# Patient Record
Sex: Male | Born: 1935 | Race: White | Hispanic: No | Marital: Married | Smoking: Never smoker
Health system: Southern US, Community
[De-identification: ages and names within clinical notes are randomized; demographics above are authoritative.]

## PROBLEM LIST (undated history)

## (undated) DIAGNOSIS — R000 Tachycardia, unspecified: Secondary | ICD-10-CM

## (undated) DIAGNOSIS — K219 Gastro-esophageal reflux disease without esophagitis: Secondary | ICD-10-CM

## (undated) DIAGNOSIS — N189 Chronic kidney disease, unspecified: Secondary | ICD-10-CM

## (undated) DIAGNOSIS — R251 Tremor, unspecified: Secondary | ICD-10-CM

## (undated) DIAGNOSIS — J309 Allergic rhinitis, unspecified: Secondary | ICD-10-CM

## (undated) DIAGNOSIS — M1990 Unspecified osteoarthritis, unspecified site: Secondary | ICD-10-CM

## (undated) DIAGNOSIS — I10 Essential (primary) hypertension: Secondary | ICD-10-CM

## (undated) DIAGNOSIS — N400 Enlarged prostate without lower urinary tract symptoms: Secondary | ICD-10-CM

## (undated) HISTORY — DX: Essential (primary) hypertension: I10

## (undated) HISTORY — DX: Gastro-esophageal reflux disease without esophagitis: K21.9

## (undated) HISTORY — DX: Chronic kidney disease, unspecified: N18.9

## (undated) HISTORY — PX: CATARACT EXTRACTION: SUR2

## (undated) HISTORY — DX: Allergic rhinitis, unspecified: J30.9

## (undated) HISTORY — DX: Tremor, unspecified: R25.1

## (undated) HISTORY — PX: HERNIA REPAIR: SHX51

## (undated) HISTORY — PX: NOSE SURGERY: SHX723

## (undated) HISTORY — PX: CARDIAC CATHETERIZATION: SHX172

## (undated) HISTORY — PX: COLONOSCOPY: SHX174

## (undated) HISTORY — DX: Benign prostatic hyperplasia without lower urinary tract symptoms: N40.0

---

## 2001-11-27 ENCOUNTER — Ambulatory Visit (HOSPITAL_COMMUNITY): Admission: RE | Admit: 2001-11-27 | Discharge: 2001-11-27 | Payer: Self-pay | Admitting: *Deleted

## 2001-11-27 ENCOUNTER — Encounter: Payer: Self-pay | Admitting: *Deleted

## 2009-05-24 ENCOUNTER — Ambulatory Visit: Payer: Self-pay | Admitting: Cardiology

## 2011-12-09 DIAGNOSIS — M9981 Other biomechanical lesions of cervical region: Secondary | ICD-10-CM | POA: Diagnosis not present

## 2011-12-09 DIAGNOSIS — M531 Cervicobrachial syndrome: Secondary | ICD-10-CM | POA: Diagnosis not present

## 2011-12-09 DIAGNOSIS — M4712 Other spondylosis with myelopathy, cervical region: Secondary | ICD-10-CM | POA: Diagnosis not present

## 2011-12-12 DIAGNOSIS — M9981 Other biomechanical lesions of cervical region: Secondary | ICD-10-CM | POA: Diagnosis not present

## 2011-12-12 DIAGNOSIS — M531 Cervicobrachial syndrome: Secondary | ICD-10-CM | POA: Diagnosis not present

## 2011-12-12 DIAGNOSIS — M4712 Other spondylosis with myelopathy, cervical region: Secondary | ICD-10-CM | POA: Diagnosis not present

## 2011-12-16 DIAGNOSIS — M9981 Other biomechanical lesions of cervical region: Secondary | ICD-10-CM | POA: Diagnosis not present

## 2011-12-16 DIAGNOSIS — M4712 Other spondylosis with myelopathy, cervical region: Secondary | ICD-10-CM | POA: Diagnosis not present

## 2011-12-16 DIAGNOSIS — M531 Cervicobrachial syndrome: Secondary | ICD-10-CM | POA: Diagnosis not present

## 2011-12-30 DIAGNOSIS — M9981 Other biomechanical lesions of cervical region: Secondary | ICD-10-CM | POA: Diagnosis not present

## 2011-12-30 DIAGNOSIS — M531 Cervicobrachial syndrome: Secondary | ICD-10-CM | POA: Diagnosis not present

## 2011-12-30 DIAGNOSIS — M4712 Other spondylosis with myelopathy, cervical region: Secondary | ICD-10-CM | POA: Diagnosis not present

## 2012-04-09 DIAGNOSIS — N400 Enlarged prostate without lower urinary tract symptoms: Secondary | ICD-10-CM | POA: Diagnosis not present

## 2012-04-09 DIAGNOSIS — I10 Essential (primary) hypertension: Secondary | ICD-10-CM | POA: Diagnosis not present

## 2012-04-15 DIAGNOSIS — I10 Essential (primary) hypertension: Secondary | ICD-10-CM | POA: Diagnosis not present

## 2012-04-15 DIAGNOSIS — J309 Allergic rhinitis, unspecified: Secondary | ICD-10-CM | POA: Diagnosis not present

## 2012-04-15 DIAGNOSIS — Z23 Encounter for immunization: Secondary | ICD-10-CM | POA: Diagnosis not present

## 2012-09-09 DIAGNOSIS — R51 Headache: Secondary | ICD-10-CM | POA: Diagnosis not present

## 2012-09-09 DIAGNOSIS — I10 Essential (primary) hypertension: Secondary | ICD-10-CM | POA: Diagnosis not present

## 2012-10-05 DIAGNOSIS — R05 Cough: Secondary | ICD-10-CM | POA: Diagnosis not present

## 2012-10-05 DIAGNOSIS — I10 Essential (primary) hypertension: Secondary | ICD-10-CM | POA: Diagnosis not present

## 2012-10-15 DIAGNOSIS — H53029 Refractive amblyopia, unspecified eye: Secondary | ICD-10-CM | POA: Diagnosis not present

## 2012-10-15 DIAGNOSIS — H43319 Vitreous membranes and strands, unspecified eye: Secondary | ICD-10-CM | POA: Diagnosis not present

## 2013-01-08 DIAGNOSIS — L821 Other seborrheic keratosis: Secondary | ICD-10-CM | POA: Diagnosis not present

## 2013-01-08 DIAGNOSIS — I10 Essential (primary) hypertension: Secondary | ICD-10-CM | POA: Diagnosis not present

## 2013-01-08 DIAGNOSIS — R05 Cough: Secondary | ICD-10-CM | POA: Diagnosis not present

## 2013-01-08 DIAGNOSIS — L570 Actinic keratosis: Secondary | ICD-10-CM | POA: Diagnosis not present

## 2013-05-07 DIAGNOSIS — I10 Essential (primary) hypertension: Secondary | ICD-10-CM | POA: Diagnosis not present

## 2013-05-14 DIAGNOSIS — Z23 Encounter for immunization: Secondary | ICD-10-CM | POA: Diagnosis not present

## 2013-05-14 DIAGNOSIS — I10 Essential (primary) hypertension: Secondary | ICD-10-CM | POA: Diagnosis not present

## 2013-05-14 DIAGNOSIS — L821 Other seborrheic keratosis: Secondary | ICD-10-CM | POA: Diagnosis not present

## 2013-05-14 DIAGNOSIS — N189 Chronic kidney disease, unspecified: Secondary | ICD-10-CM | POA: Diagnosis not present

## 2013-05-14 DIAGNOSIS — R7309 Other abnormal glucose: Secondary | ICD-10-CM | POA: Diagnosis not present

## 2013-05-14 DIAGNOSIS — E786 Lipoprotein deficiency: Secondary | ICD-10-CM | POA: Diagnosis not present

## 2013-11-04 DIAGNOSIS — R7309 Other abnormal glucose: Secondary | ICD-10-CM | POA: Diagnosis not present

## 2013-11-04 DIAGNOSIS — I10 Essential (primary) hypertension: Secondary | ICD-10-CM | POA: Diagnosis not present

## 2013-11-11 DIAGNOSIS — R7309 Other abnormal glucose: Secondary | ICD-10-CM | POA: Diagnosis not present

## 2013-11-11 DIAGNOSIS — K2100 Gastro-esophageal reflux disease with esophagitis, without bleeding: Secondary | ICD-10-CM | POA: Diagnosis not present

## 2013-11-11 DIAGNOSIS — N400 Enlarged prostate without lower urinary tract symptoms: Secondary | ICD-10-CM | POA: Diagnosis not present

## 2013-11-11 DIAGNOSIS — E786 Lipoprotein deficiency: Secondary | ICD-10-CM | POA: Diagnosis not present

## 2013-11-11 DIAGNOSIS — I10 Essential (primary) hypertension: Secondary | ICD-10-CM | POA: Diagnosis not present

## 2013-11-11 DIAGNOSIS — N189 Chronic kidney disease, unspecified: Secondary | ICD-10-CM | POA: Diagnosis not present

## 2014-01-04 DIAGNOSIS — H26499 Other secondary cataract, unspecified eye: Secondary | ICD-10-CM | POA: Diagnosis not present

## 2014-01-15 DIAGNOSIS — IMO0002 Reserved for concepts with insufficient information to code with codable children: Secondary | ICD-10-CM | POA: Diagnosis not present

## 2014-01-15 DIAGNOSIS — S51809A Unspecified open wound of unspecified forearm, initial encounter: Secondary | ICD-10-CM | POA: Diagnosis not present

## 2014-01-17 DIAGNOSIS — M47817 Spondylosis without myelopathy or radiculopathy, lumbosacral region: Secondary | ICD-10-CM | POA: Diagnosis not present

## 2014-01-17 DIAGNOSIS — M999 Biomechanical lesion, unspecified: Secondary | ICD-10-CM | POA: Diagnosis not present

## 2014-01-19 DIAGNOSIS — M999 Biomechanical lesion, unspecified: Secondary | ICD-10-CM | POA: Diagnosis not present

## 2014-01-19 DIAGNOSIS — M47817 Spondylosis without myelopathy or radiculopathy, lumbosacral region: Secondary | ICD-10-CM | POA: Diagnosis not present

## 2014-01-20 DIAGNOSIS — M999 Biomechanical lesion, unspecified: Secondary | ICD-10-CM | POA: Diagnosis not present

## 2014-01-20 DIAGNOSIS — M47817 Spondylosis without myelopathy or radiculopathy, lumbosacral region: Secondary | ICD-10-CM | POA: Diagnosis not present

## 2014-05-11 DIAGNOSIS — N189 Chronic kidney disease, unspecified: Secondary | ICD-10-CM | POA: Diagnosis not present

## 2014-05-11 DIAGNOSIS — I10 Essential (primary) hypertension: Secondary | ICD-10-CM | POA: Diagnosis not present

## 2014-05-11 DIAGNOSIS — R739 Hyperglycemia, unspecified: Secondary | ICD-10-CM | POA: Diagnosis not present

## 2014-05-20 DIAGNOSIS — E786 Lipoprotein deficiency: Secondary | ICD-10-CM | POA: Diagnosis not present

## 2014-05-20 DIAGNOSIS — I10 Essential (primary) hypertension: Secondary | ICD-10-CM | POA: Diagnosis not present

## 2014-05-20 DIAGNOSIS — N400 Enlarged prostate without lower urinary tract symptoms: Secondary | ICD-10-CM | POA: Diagnosis not present

## 2014-05-20 DIAGNOSIS — K210 Gastro-esophageal reflux disease with esophagitis: Secondary | ICD-10-CM | POA: Diagnosis not present

## 2014-05-20 DIAGNOSIS — N183 Chronic kidney disease, stage 3 (moderate): Secondary | ICD-10-CM | POA: Diagnosis not present

## 2014-05-20 DIAGNOSIS — Z23 Encounter for immunization: Secondary | ICD-10-CM | POA: Diagnosis not present

## 2014-10-24 DIAGNOSIS — Z961 Presence of intraocular lens: Secondary | ICD-10-CM | POA: Diagnosis not present

## 2014-11-09 DIAGNOSIS — H26491 Other secondary cataract, right eye: Secondary | ICD-10-CM | POA: Diagnosis not present

## 2014-11-10 DIAGNOSIS — N183 Chronic kidney disease, stage 3 (moderate): Secondary | ICD-10-CM | POA: Diagnosis not present

## 2014-11-10 DIAGNOSIS — K210 Gastro-esophageal reflux disease with esophagitis: Secondary | ICD-10-CM | POA: Diagnosis not present

## 2014-11-10 DIAGNOSIS — I10 Essential (primary) hypertension: Secondary | ICD-10-CM | POA: Diagnosis not present

## 2014-11-10 DIAGNOSIS — R739 Hyperglycemia, unspecified: Secondary | ICD-10-CM | POA: Diagnosis not present

## 2014-11-16 DIAGNOSIS — K210 Gastro-esophageal reflux disease with esophagitis: Secondary | ICD-10-CM | POA: Diagnosis not present

## 2014-11-16 DIAGNOSIS — Z1389 Encounter for screening for other disorder: Secondary | ICD-10-CM | POA: Diagnosis not present

## 2014-11-16 DIAGNOSIS — D485 Neoplasm of uncertain behavior of skin: Secondary | ICD-10-CM | POA: Diagnosis not present

## 2014-11-16 DIAGNOSIS — I10 Essential (primary) hypertension: Secondary | ICD-10-CM | POA: Diagnosis not present

## 2014-11-16 DIAGNOSIS — J301 Allergic rhinitis due to pollen: Secondary | ICD-10-CM | POA: Diagnosis not present

## 2014-11-16 DIAGNOSIS — Z0001 Encounter for general adult medical examination with abnormal findings: Secondary | ICD-10-CM | POA: Diagnosis not present

## 2015-01-09 DIAGNOSIS — Z961 Presence of intraocular lens: Secondary | ICD-10-CM | POA: Diagnosis not present

## 2015-03-20 DIAGNOSIS — L570 Actinic keratosis: Secondary | ICD-10-CM | POA: Diagnosis not present

## 2015-03-20 DIAGNOSIS — L821 Other seborrheic keratosis: Secondary | ICD-10-CM | POA: Diagnosis not present

## 2015-03-20 DIAGNOSIS — L820 Inflamed seborrheic keratosis: Secondary | ICD-10-CM | POA: Diagnosis not present

## 2015-03-20 DIAGNOSIS — D0421 Carcinoma in situ of skin of right ear and external auricular canal: Secondary | ICD-10-CM | POA: Diagnosis not present

## 2015-03-20 DIAGNOSIS — D485 Neoplasm of uncertain behavior of skin: Secondary | ICD-10-CM | POA: Diagnosis not present

## 2015-04-06 DIAGNOSIS — C44222 Squamous cell carcinoma of skin of right ear and external auricular canal: Secondary | ICD-10-CM | POA: Diagnosis not present

## 2015-05-09 DIAGNOSIS — I10 Essential (primary) hypertension: Secondary | ICD-10-CM | POA: Diagnosis not present

## 2015-05-09 DIAGNOSIS — R739 Hyperglycemia, unspecified: Secondary | ICD-10-CM | POA: Diagnosis not present

## 2015-05-09 DIAGNOSIS — K210 Gastro-esophageal reflux disease with esophagitis: Secondary | ICD-10-CM | POA: Diagnosis not present

## 2015-05-09 DIAGNOSIS — N183 Chronic kidney disease, stage 3 (moderate): Secondary | ICD-10-CM | POA: Diagnosis not present

## 2015-05-16 DIAGNOSIS — N401 Enlarged prostate with lower urinary tract symptoms: Secondary | ICD-10-CM | POA: Diagnosis not present

## 2015-05-16 DIAGNOSIS — I10 Essential (primary) hypertension: Secondary | ICD-10-CM | POA: Diagnosis not present

## 2015-05-16 DIAGNOSIS — E786 Lipoprotein deficiency: Secondary | ICD-10-CM | POA: Diagnosis not present

## 2015-05-16 DIAGNOSIS — K210 Gastro-esophageal reflux disease with esophagitis: Secondary | ICD-10-CM | POA: Diagnosis not present

## 2015-05-16 DIAGNOSIS — N183 Chronic kidney disease, stage 3 (moderate): Secondary | ICD-10-CM | POA: Diagnosis not present

## 2015-05-16 DIAGNOSIS — Z23 Encounter for immunization: Secondary | ICD-10-CM | POA: Diagnosis not present

## 2015-06-06 DIAGNOSIS — D485 Neoplasm of uncertain behavior of skin: Secondary | ICD-10-CM | POA: Diagnosis not present

## 2015-06-06 DIAGNOSIS — D225 Melanocytic nevi of trunk: Secondary | ICD-10-CM | POA: Diagnosis not present

## 2015-06-06 DIAGNOSIS — L570 Actinic keratosis: Secondary | ICD-10-CM | POA: Diagnosis not present

## 2015-06-19 DIAGNOSIS — J209 Acute bronchitis, unspecified: Secondary | ICD-10-CM | POA: Diagnosis not present

## 2015-06-19 DIAGNOSIS — R05 Cough: Secondary | ICD-10-CM | POA: Diagnosis not present

## 2015-07-28 DIAGNOSIS — H8112 Benign paroxysmal vertigo, left ear: Secondary | ICD-10-CM | POA: Diagnosis not present

## 2015-07-28 DIAGNOSIS — N400 Enlarged prostate without lower urinary tract symptoms: Secondary | ICD-10-CM | POA: Diagnosis not present

## 2015-08-07 DIAGNOSIS — D485 Neoplasm of uncertain behavior of skin: Secondary | ICD-10-CM | POA: Diagnosis not present

## 2015-08-07 DIAGNOSIS — C44629 Squamous cell carcinoma of skin of left upper limb, including shoulder: Secondary | ICD-10-CM | POA: Diagnosis not present

## 2015-08-07 DIAGNOSIS — L570 Actinic keratosis: Secondary | ICD-10-CM | POA: Diagnosis not present

## 2015-08-07 DIAGNOSIS — L280 Lichen simplex chronicus: Secondary | ICD-10-CM | POA: Diagnosis not present

## 2015-08-07 DIAGNOSIS — D0462 Carcinoma in situ of skin of left upper limb, including shoulder: Secondary | ICD-10-CM | POA: Diagnosis not present

## 2015-08-17 DIAGNOSIS — C44629 Squamous cell carcinoma of skin of left upper limb, including shoulder: Secondary | ICD-10-CM | POA: Diagnosis not present

## 2015-10-31 DIAGNOSIS — K210 Gastro-esophageal reflux disease with esophagitis: Secondary | ICD-10-CM | POA: Diagnosis not present

## 2015-10-31 DIAGNOSIS — I10 Essential (primary) hypertension: Secondary | ICD-10-CM | POA: Diagnosis not present

## 2015-10-31 DIAGNOSIS — R739 Hyperglycemia, unspecified: Secondary | ICD-10-CM | POA: Diagnosis not present

## 2015-10-31 DIAGNOSIS — N183 Chronic kidney disease, stage 3 (moderate): Secondary | ICD-10-CM | POA: Diagnosis not present

## 2015-11-01 DIAGNOSIS — I10 Essential (primary) hypertension: Secondary | ICD-10-CM | POA: Diagnosis not present

## 2015-11-23 DIAGNOSIS — F5221 Male erectile disorder: Secondary | ICD-10-CM | POA: Diagnosis not present

## 2015-11-23 DIAGNOSIS — N183 Chronic kidney disease, stage 3 (moderate): Secondary | ICD-10-CM | POA: Diagnosis not present

## 2015-11-23 DIAGNOSIS — K210 Gastro-esophageal reflux disease with esophagitis: Secondary | ICD-10-CM | POA: Diagnosis not present

## 2015-11-23 DIAGNOSIS — Z0001 Encounter for general adult medical examination with abnormal findings: Secondary | ICD-10-CM | POA: Diagnosis not present

## 2015-11-23 DIAGNOSIS — I10 Essential (primary) hypertension: Secondary | ICD-10-CM | POA: Diagnosis not present

## 2015-11-23 DIAGNOSIS — N401 Enlarged prostate with lower urinary tract symptoms: Secondary | ICD-10-CM | POA: Diagnosis not present

## 2016-01-23 DIAGNOSIS — N400 Enlarged prostate without lower urinary tract symptoms: Secondary | ICD-10-CM | POA: Diagnosis not present

## 2016-02-05 DIAGNOSIS — L570 Actinic keratosis: Secondary | ICD-10-CM | POA: Diagnosis not present

## 2016-05-21 DIAGNOSIS — Z23 Encounter for immunization: Secondary | ICD-10-CM | POA: Diagnosis not present

## 2016-05-25 DIAGNOSIS — S68022A Partial traumatic metacarpophalangeal amputation of left thumb, initial encounter: Secondary | ICD-10-CM | POA: Diagnosis not present

## 2016-05-25 DIAGNOSIS — S61012A Laceration without foreign body of left thumb without damage to nail, initial encounter: Secondary | ICD-10-CM | POA: Diagnosis not present

## 2016-05-25 DIAGNOSIS — W312XXA Contact with powered woodworking and forming machines, initial encounter: Secondary | ICD-10-CM | POA: Diagnosis not present

## 2016-05-27 DIAGNOSIS — S61019D Laceration without foreign body of unspecified thumb without damage to nail, subsequent encounter: Secondary | ICD-10-CM | POA: Diagnosis not present

## 2016-05-27 DIAGNOSIS — Z4800 Encounter for change or removal of nonsurgical wound dressing: Secondary | ICD-10-CM | POA: Diagnosis not present

## 2016-05-27 DIAGNOSIS — S61012D Laceration without foreign body of left thumb without damage to nail, subsequent encounter: Secondary | ICD-10-CM | POA: Diagnosis not present

## 2016-05-29 DIAGNOSIS — S61012D Laceration without foreign body of left thumb without damage to nail, subsequent encounter: Secondary | ICD-10-CM | POA: Diagnosis not present

## 2016-05-30 DIAGNOSIS — M79645 Pain in left finger(s): Secondary | ICD-10-CM | POA: Diagnosis not present

## 2016-05-30 DIAGNOSIS — S61112A Laceration without foreign body of left thumb with damage to nail, initial encounter: Secondary | ICD-10-CM | POA: Diagnosis not present

## 2016-06-05 DIAGNOSIS — M79645 Pain in left finger(s): Secondary | ICD-10-CM | POA: Diagnosis not present

## 2016-06-05 DIAGNOSIS — S61112A Laceration without foreign body of left thumb with damage to nail, initial encounter: Secondary | ICD-10-CM | POA: Diagnosis not present

## 2016-06-11 DIAGNOSIS — S61112A Laceration without foreign body of left thumb with damage to nail, initial encounter: Secondary | ICD-10-CM | POA: Diagnosis not present

## 2016-07-02 DIAGNOSIS — S61112A Laceration without foreign body of left thumb with damage to nail, initial encounter: Secondary | ICD-10-CM | POA: Diagnosis not present

## 2016-08-07 DIAGNOSIS — L570 Actinic keratosis: Secondary | ICD-10-CM | POA: Diagnosis not present

## 2016-11-21 DIAGNOSIS — N183 Chronic kidney disease, stage 3 (moderate): Secondary | ICD-10-CM | POA: Diagnosis not present

## 2016-11-21 DIAGNOSIS — I10 Essential (primary) hypertension: Secondary | ICD-10-CM | POA: Diagnosis not present

## 2016-11-21 DIAGNOSIS — R739 Hyperglycemia, unspecified: Secondary | ICD-10-CM | POA: Diagnosis not present

## 2016-11-21 DIAGNOSIS — K210 Gastro-esophageal reflux disease with esophagitis: Secondary | ICD-10-CM | POA: Diagnosis not present

## 2016-11-25 DIAGNOSIS — N183 Chronic kidney disease, stage 3 (moderate): Secondary | ICD-10-CM | POA: Diagnosis not present

## 2016-11-25 DIAGNOSIS — M40295 Other kyphosis, thoracolumbar region: Secondary | ICD-10-CM | POA: Diagnosis not present

## 2016-11-25 DIAGNOSIS — Z23 Encounter for immunization: Secondary | ICD-10-CM | POA: Diagnosis not present

## 2016-11-25 DIAGNOSIS — I10 Essential (primary) hypertension: Secondary | ICD-10-CM | POA: Diagnosis not present

## 2016-11-25 DIAGNOSIS — F5221 Male erectile disorder: Secondary | ICD-10-CM | POA: Diagnosis not present

## 2016-11-25 DIAGNOSIS — Z1212 Encounter for screening for malignant neoplasm of rectum: Secondary | ICD-10-CM | POA: Diagnosis not present

## 2016-11-25 DIAGNOSIS — Z6828 Body mass index (BMI) 28.0-28.9, adult: Secondary | ICD-10-CM | POA: Diagnosis not present

## 2016-11-25 DIAGNOSIS — Z0001 Encounter for general adult medical examination with abnormal findings: Secondary | ICD-10-CM | POA: Diagnosis not present

## 2016-11-30 DIAGNOSIS — J301 Allergic rhinitis due to pollen: Secondary | ICD-10-CM | POA: Diagnosis not present

## 2016-11-30 DIAGNOSIS — Z6828 Body mass index (BMI) 28.0-28.9, adult: Secondary | ICD-10-CM | POA: Diagnosis not present

## 2016-11-30 DIAGNOSIS — R05 Cough: Secondary | ICD-10-CM | POA: Diagnosis not present

## 2017-01-13 DIAGNOSIS — H43393 Other vitreous opacities, bilateral: Secondary | ICD-10-CM | POA: Diagnosis not present

## 2017-02-10 DIAGNOSIS — L570 Actinic keratosis: Secondary | ICD-10-CM | POA: Diagnosis not present

## 2017-04-25 DIAGNOSIS — M545 Low back pain: Secondary | ICD-10-CM | POA: Diagnosis not present

## 2017-04-25 DIAGNOSIS — M4317 Spondylolisthesis, lumbosacral region: Secondary | ICD-10-CM | POA: Diagnosis not present

## 2017-04-25 DIAGNOSIS — M47816 Spondylosis without myelopathy or radiculopathy, lumbar region: Secondary | ICD-10-CM | POA: Diagnosis not present

## 2017-04-25 DIAGNOSIS — Z23 Encounter for immunization: Secondary | ICD-10-CM | POA: Diagnosis not present

## 2017-04-25 DIAGNOSIS — N401 Enlarged prostate with lower urinary tract symptoms: Secondary | ICD-10-CM | POA: Diagnosis not present

## 2017-04-25 DIAGNOSIS — Z6828 Body mass index (BMI) 28.0-28.9, adult: Secondary | ICD-10-CM | POA: Diagnosis not present

## 2017-08-13 DIAGNOSIS — L570 Actinic keratosis: Secondary | ICD-10-CM | POA: Diagnosis not present

## 2017-08-13 DIAGNOSIS — Z85828 Personal history of other malignant neoplasm of skin: Secondary | ICD-10-CM | POA: Diagnosis not present

## 2017-08-13 DIAGNOSIS — D485 Neoplasm of uncertain behavior of skin: Secondary | ICD-10-CM | POA: Diagnosis not present

## 2017-08-13 DIAGNOSIS — D235 Other benign neoplasm of skin of trunk: Secondary | ICD-10-CM | POA: Diagnosis not present

## 2017-09-16 DIAGNOSIS — M5416 Radiculopathy, lumbar region: Secondary | ICD-10-CM | POA: Diagnosis not present

## 2017-10-03 DIAGNOSIS — M5136 Other intervertebral disc degeneration, lumbar region: Secondary | ICD-10-CM | POA: Diagnosis not present

## 2017-10-03 DIAGNOSIS — M545 Low back pain: Secondary | ICD-10-CM | POA: Diagnosis not present

## 2017-10-03 DIAGNOSIS — M5137 Other intervertebral disc degeneration, lumbosacral region: Secondary | ICD-10-CM | POA: Diagnosis not present

## 2017-10-22 DIAGNOSIS — M5416 Radiculopathy, lumbar region: Secondary | ICD-10-CM | POA: Diagnosis not present

## 2017-10-22 DIAGNOSIS — M5136 Other intervertebral disc degeneration, lumbar region: Secondary | ICD-10-CM | POA: Diagnosis not present

## 2017-10-22 DIAGNOSIS — M4316 Spondylolisthesis, lumbar region: Secondary | ICD-10-CM | POA: Diagnosis not present

## 2017-12-04 DIAGNOSIS — M5136 Other intervertebral disc degeneration, lumbar region: Secondary | ICD-10-CM | POA: Diagnosis not present

## 2017-12-04 DIAGNOSIS — M5416 Radiculopathy, lumbar region: Secondary | ICD-10-CM | POA: Diagnosis not present

## 2017-12-24 DIAGNOSIS — M5136 Other intervertebral disc degeneration, lumbar region: Secondary | ICD-10-CM | POA: Diagnosis not present

## 2017-12-24 DIAGNOSIS — M5416 Radiculopathy, lumbar region: Secondary | ICD-10-CM | POA: Diagnosis not present

## 2018-01-28 DIAGNOSIS — H524 Presbyopia: Secondary | ICD-10-CM | POA: Diagnosis not present

## 2018-01-28 DIAGNOSIS — H5203 Hypermetropia, bilateral: Secondary | ICD-10-CM | POA: Diagnosis not present

## 2018-01-28 DIAGNOSIS — H53021 Refractive amblyopia, right eye: Secondary | ICD-10-CM | POA: Diagnosis not present

## 2018-01-28 DIAGNOSIS — H43813 Vitreous degeneration, bilateral: Secondary | ICD-10-CM | POA: Diagnosis not present

## 2018-01-28 DIAGNOSIS — H52223 Regular astigmatism, bilateral: Secondary | ICD-10-CM | POA: Diagnosis not present

## 2018-01-28 DIAGNOSIS — Z961 Presence of intraocular lens: Secondary | ICD-10-CM | POA: Diagnosis not present

## 2018-02-05 DIAGNOSIS — E786 Lipoprotein deficiency: Secondary | ICD-10-CM | POA: Diagnosis not present

## 2018-02-05 DIAGNOSIS — I10 Essential (primary) hypertension: Secondary | ICD-10-CM | POA: Diagnosis not present

## 2018-02-05 DIAGNOSIS — N183 Chronic kidney disease, stage 3 (moderate): Secondary | ICD-10-CM | POA: Diagnosis not present

## 2018-02-05 DIAGNOSIS — R739 Hyperglycemia, unspecified: Secondary | ICD-10-CM | POA: Diagnosis not present

## 2018-02-05 DIAGNOSIS — K210 Gastro-esophageal reflux disease with esophagitis: Secondary | ICD-10-CM | POA: Diagnosis not present

## 2018-02-05 DIAGNOSIS — F5221 Male erectile disorder: Secondary | ICD-10-CM | POA: Diagnosis not present

## 2018-02-09 DIAGNOSIS — Z6827 Body mass index (BMI) 27.0-27.9, adult: Secondary | ICD-10-CM | POA: Diagnosis not present

## 2018-02-09 DIAGNOSIS — F5221 Male erectile disorder: Secondary | ICD-10-CM | POA: Diagnosis not present

## 2018-02-09 DIAGNOSIS — Z1212 Encounter for screening for malignant neoplasm of rectum: Secondary | ICD-10-CM | POA: Diagnosis not present

## 2018-02-09 DIAGNOSIS — K219 Gastro-esophageal reflux disease without esophagitis: Secondary | ICD-10-CM | POA: Diagnosis not present

## 2018-02-09 DIAGNOSIS — M40295 Other kyphosis, thoracolumbar region: Secondary | ICD-10-CM | POA: Diagnosis not present

## 2018-02-09 DIAGNOSIS — Z0001 Encounter for general adult medical examination with abnormal findings: Secondary | ICD-10-CM | POA: Diagnosis not present

## 2018-02-09 DIAGNOSIS — R634 Abnormal weight loss: Secondary | ICD-10-CM | POA: Diagnosis not present

## 2018-02-09 DIAGNOSIS — N183 Chronic kidney disease, stage 3 (moderate): Secondary | ICD-10-CM | POA: Diagnosis not present

## 2018-02-09 DIAGNOSIS — I10 Essential (primary) hypertension: Secondary | ICD-10-CM | POA: Diagnosis not present

## 2018-02-09 DIAGNOSIS — N401 Enlarged prostate with lower urinary tract symptoms: Secondary | ICD-10-CM | POA: Diagnosis not present

## 2018-02-10 DIAGNOSIS — L570 Actinic keratosis: Secondary | ICD-10-CM | POA: Diagnosis not present

## 2018-02-25 DIAGNOSIS — M5136 Other intervertebral disc degeneration, lumbar region: Secondary | ICD-10-CM | POA: Diagnosis not present

## 2018-02-25 DIAGNOSIS — M4316 Spondylolisthesis, lumbar region: Secondary | ICD-10-CM | POA: Diagnosis not present

## 2018-02-25 DIAGNOSIS — M5416 Radiculopathy, lumbar region: Secondary | ICD-10-CM | POA: Diagnosis not present

## 2018-02-28 DIAGNOSIS — M5416 Radiculopathy, lumbar region: Secondary | ICD-10-CM | POA: Diagnosis not present

## 2018-03-05 DIAGNOSIS — M5416 Radiculopathy, lumbar region: Secondary | ICD-10-CM | POA: Diagnosis not present

## 2018-03-05 DIAGNOSIS — M4316 Spondylolisthesis, lumbar region: Secondary | ICD-10-CM | POA: Diagnosis not present

## 2018-03-05 DIAGNOSIS — M48061 Spinal stenosis, lumbar region without neurogenic claudication: Secondary | ICD-10-CM | POA: Diagnosis not present

## 2018-03-05 DIAGNOSIS — M5136 Other intervertebral disc degeneration, lumbar region: Secondary | ICD-10-CM | POA: Diagnosis not present

## 2018-04-01 DIAGNOSIS — M5136 Other intervertebral disc degeneration, lumbar region: Secondary | ICD-10-CM | POA: Diagnosis not present

## 2018-04-01 DIAGNOSIS — M48061 Spinal stenosis, lumbar region without neurogenic claudication: Secondary | ICD-10-CM | POA: Diagnosis not present

## 2018-05-14 DIAGNOSIS — Z23 Encounter for immunization: Secondary | ICD-10-CM | POA: Diagnosis not present

## 2018-06-16 DIAGNOSIS — M4316 Spondylolisthesis, lumbar region: Secondary | ICD-10-CM | POA: Diagnosis not present

## 2018-06-16 DIAGNOSIS — M5416 Radiculopathy, lumbar region: Secondary | ICD-10-CM | POA: Diagnosis not present

## 2018-06-27 DIAGNOSIS — J329 Chronic sinusitis, unspecified: Secondary | ICD-10-CM | POA: Diagnosis not present

## 2018-06-27 DIAGNOSIS — H109 Unspecified conjunctivitis: Secondary | ICD-10-CM | POA: Diagnosis not present

## 2018-06-27 DIAGNOSIS — Z6827 Body mass index (BMI) 27.0-27.9, adult: Secondary | ICD-10-CM | POA: Diagnosis not present

## 2018-07-07 DIAGNOSIS — M5136 Other intervertebral disc degeneration, lumbar region: Secondary | ICD-10-CM | POA: Diagnosis not present

## 2018-07-07 DIAGNOSIS — M5416 Radiculopathy, lumbar region: Secondary | ICD-10-CM | POA: Diagnosis not present

## 2018-08-03 DIAGNOSIS — D0422 Carcinoma in situ of skin of left ear and external auricular canal: Secondary | ICD-10-CM | POA: Diagnosis not present

## 2018-08-03 DIAGNOSIS — L570 Actinic keratosis: Secondary | ICD-10-CM | POA: Diagnosis not present

## 2018-08-03 DIAGNOSIS — D485 Neoplasm of uncertain behavior of skin: Secondary | ICD-10-CM | POA: Diagnosis not present

## 2018-08-06 DIAGNOSIS — N183 Chronic kidney disease, stage 3 (moderate): Secondary | ICD-10-CM | POA: Diagnosis not present

## 2018-08-06 DIAGNOSIS — I10 Essential (primary) hypertension: Secondary | ICD-10-CM | POA: Diagnosis not present

## 2018-08-06 DIAGNOSIS — R634 Abnormal weight loss: Secondary | ICD-10-CM | POA: Diagnosis not present

## 2018-08-06 DIAGNOSIS — R739 Hyperglycemia, unspecified: Secondary | ICD-10-CM | POA: Diagnosis not present

## 2018-08-06 DIAGNOSIS — K210 Gastro-esophageal reflux disease with esophagitis: Secondary | ICD-10-CM | POA: Diagnosis not present

## 2018-08-06 DIAGNOSIS — E786 Lipoprotein deficiency: Secondary | ICD-10-CM | POA: Diagnosis not present

## 2018-08-11 DIAGNOSIS — M40295 Other kyphosis, thoracolumbar region: Secondary | ICD-10-CM | POA: Diagnosis not present

## 2018-08-11 DIAGNOSIS — N183 Chronic kidney disease, stage 3 (moderate): Secondary | ICD-10-CM | POA: Diagnosis not present

## 2018-08-11 DIAGNOSIS — I10 Essential (primary) hypertension: Secondary | ICD-10-CM | POA: Diagnosis not present

## 2018-08-11 DIAGNOSIS — F5221 Male erectile disorder: Secondary | ICD-10-CM | POA: Diagnosis not present

## 2018-08-11 DIAGNOSIS — R634 Abnormal weight loss: Secondary | ICD-10-CM | POA: Diagnosis not present

## 2018-08-11 DIAGNOSIS — K219 Gastro-esophageal reflux disease without esophagitis: Secondary | ICD-10-CM | POA: Diagnosis not present

## 2018-08-11 DIAGNOSIS — N401 Enlarged prostate with lower urinary tract symptoms: Secondary | ICD-10-CM | POA: Diagnosis not present

## 2018-08-11 DIAGNOSIS — Z6827 Body mass index (BMI) 27.0-27.9, adult: Secondary | ICD-10-CM | POA: Diagnosis not present

## 2018-08-13 DIAGNOSIS — C44329 Squamous cell carcinoma of skin of other parts of face: Secondary | ICD-10-CM | POA: Diagnosis not present

## 2018-09-01 DIAGNOSIS — M1711 Unilateral primary osteoarthritis, right knee: Secondary | ICD-10-CM | POA: Diagnosis not present

## 2018-09-01 DIAGNOSIS — M25561 Pain in right knee: Secondary | ICD-10-CM | POA: Diagnosis not present

## 2018-09-04 ENCOUNTER — Other Ambulatory Visit: Payer: Self-pay | Admitting: Specialist

## 2018-09-04 DIAGNOSIS — M5416 Radiculopathy, lumbar region: Secondary | ICD-10-CM

## 2018-09-07 ENCOUNTER — Telehealth: Payer: Self-pay | Admitting: Nurse Practitioner

## 2018-09-07 NOTE — Telephone Encounter (Signed)
Phone call to patient to verify medication list and allergies for myelogram procedure. Spoke with wife, pt instructed to hold Tramadol for 48hrs prior to myelogram appointment time. Pt's wife verbalized understanding.

## 2018-09-14 ENCOUNTER — Ambulatory Visit
Admission: RE | Admit: 2018-09-14 | Discharge: 2018-09-14 | Disposition: A | Discharge status: Home / Self Care | Payer: Self-pay | Source: Ambulatory Visit | Attending: Specialist | Admitting: Specialist

## 2018-09-14 ENCOUNTER — Other Ambulatory Visit: Payer: Self-pay | Admitting: Specialist

## 2018-09-14 DIAGNOSIS — M5416 Radiculopathy, lumbar region: Secondary | ICD-10-CM

## 2018-09-15 ENCOUNTER — Ambulatory Visit
Admission: RE | Admit: 2018-09-15 | Discharge: 2018-09-15 | Disposition: A | Discharge status: Home / Self Care | Payer: Self-pay | Source: Ambulatory Visit | Attending: Specialist | Admitting: Specialist

## 2018-09-15 ENCOUNTER — Ambulatory Visit
Admission: RE | Admit: 2018-09-15 | Discharge: 2018-09-15 | Disposition: A | Discharge status: Home / Self Care | Payer: Medicare Other | Source: Ambulatory Visit | Attending: Specialist | Admitting: Specialist

## 2018-09-15 DIAGNOSIS — M48061 Spinal stenosis, lumbar region without neurogenic claudication: Secondary | ICD-10-CM | POA: Diagnosis not present

## 2018-09-15 DIAGNOSIS — M5416 Radiculopathy, lumbar region: Secondary | ICD-10-CM

## 2018-09-15 MED ORDER — DIAZEPAM 5 MG PO TABS
5.0000 mg | ORAL_TABLET | Freq: Once | ORAL | Status: AC
  Administered 2018-09-15: 5 mg via ORAL

## 2018-09-15 MED ORDER — IOPAMIDOL (ISOVUE-M 200) INJECTION 41%
15.0000 mL | Freq: Once | INTRAMUSCULAR | Status: AC
  Administered 2018-09-15: 15 mL via INTRATHECAL

## 2018-09-15 NOTE — Discharge Instructions (Signed)

## 2018-09-15 NOTE — Progress Notes (Signed)
Patient states he has been off Tramadol for at least the past two days.  Gypsy Lore, RN

## 2018-09-23 DIAGNOSIS — M5416 Radiculopathy, lumbar region: Secondary | ICD-10-CM | POA: Diagnosis not present

## 2018-09-23 DIAGNOSIS — M519 Unspecified thoracic, thoracolumbar and lumbosacral intervertebral disc disorder: Secondary | ICD-10-CM | POA: Diagnosis not present

## 2018-09-23 DIAGNOSIS — M5136 Other intervertebral disc degeneration, lumbar region: Secondary | ICD-10-CM | POA: Diagnosis not present

## 2018-09-23 DIAGNOSIS — M48061 Spinal stenosis, lumbar region without neurogenic claudication: Secondary | ICD-10-CM | POA: Diagnosis not present

## 2018-09-23 DIAGNOSIS — M25812 Other specified joint disorders, left shoulder: Secondary | ICD-10-CM | POA: Diagnosis not present

## 2018-09-23 DIAGNOSIS — M25512 Pain in left shoulder: Secondary | ICD-10-CM | POA: Diagnosis not present

## 2018-10-06 DIAGNOSIS — M545 Low back pain: Secondary | ICD-10-CM | POA: Diagnosis not present

## 2018-12-23 DIAGNOSIS — M1711 Unilateral primary osteoarthritis, right knee: Secondary | ICD-10-CM | POA: Diagnosis not present

## 2018-12-30 DIAGNOSIS — M1711 Unilateral primary osteoarthritis, right knee: Secondary | ICD-10-CM | POA: Diagnosis not present

## 2019-01-06 DIAGNOSIS — M1711 Unilateral primary osteoarthritis, right knee: Secondary | ICD-10-CM | POA: Diagnosis not present

## 2019-02-02 DIAGNOSIS — M25512 Pain in left shoulder: Secondary | ICD-10-CM | POA: Diagnosis not present

## 2019-02-02 DIAGNOSIS — M6281 Muscle weakness (generalized): Secondary | ICD-10-CM | POA: Diagnosis not present

## 2019-02-04 DIAGNOSIS — M25512 Pain in left shoulder: Secondary | ICD-10-CM | POA: Diagnosis not present

## 2019-02-04 DIAGNOSIS — M6281 Muscle weakness (generalized): Secondary | ICD-10-CM | POA: Diagnosis not present

## 2019-02-08 DIAGNOSIS — L570 Actinic keratosis: Secondary | ICD-10-CM | POA: Diagnosis not present

## 2019-02-09 DIAGNOSIS — M25512 Pain in left shoulder: Secondary | ICD-10-CM | POA: Diagnosis not present

## 2019-02-09 DIAGNOSIS — M6281 Muscle weakness (generalized): Secondary | ICD-10-CM | POA: Diagnosis not present

## 2019-02-11 DIAGNOSIS — M25512 Pain in left shoulder: Secondary | ICD-10-CM | POA: Diagnosis not present

## 2019-02-11 DIAGNOSIS — M6281 Muscle weakness (generalized): Secondary | ICD-10-CM | POA: Diagnosis not present

## 2019-02-16 DIAGNOSIS — M6281 Muscle weakness (generalized): Secondary | ICD-10-CM | POA: Diagnosis not present

## 2019-02-16 DIAGNOSIS — M25512 Pain in left shoulder: Secondary | ICD-10-CM | POA: Diagnosis not present

## 2019-02-18 DIAGNOSIS — M6281 Muscle weakness (generalized): Secondary | ICD-10-CM | POA: Diagnosis not present

## 2019-02-18 DIAGNOSIS — M25512 Pain in left shoulder: Secondary | ICD-10-CM | POA: Diagnosis not present

## 2019-02-23 DIAGNOSIS — M6281 Muscle weakness (generalized): Secondary | ICD-10-CM | POA: Diagnosis not present

## 2019-02-23 DIAGNOSIS — M25512 Pain in left shoulder: Secondary | ICD-10-CM | POA: Diagnosis not present

## 2019-02-25 DIAGNOSIS — M6281 Muscle weakness (generalized): Secondary | ICD-10-CM | POA: Diagnosis not present

## 2019-02-25 DIAGNOSIS — M25512 Pain in left shoulder: Secondary | ICD-10-CM | POA: Diagnosis not present

## 2019-03-02 DIAGNOSIS — M25512 Pain in left shoulder: Secondary | ICD-10-CM | POA: Diagnosis not present

## 2019-03-02 DIAGNOSIS — M6281 Muscle weakness (generalized): Secondary | ICD-10-CM | POA: Diagnosis not present

## 2019-03-04 DIAGNOSIS — M25512 Pain in left shoulder: Secondary | ICD-10-CM | POA: Diagnosis not present

## 2019-03-04 DIAGNOSIS — M6281 Muscle weakness (generalized): Secondary | ICD-10-CM | POA: Diagnosis not present

## 2019-03-09 DIAGNOSIS — M6281 Muscle weakness (generalized): Secondary | ICD-10-CM | POA: Diagnosis not present

## 2019-03-09 DIAGNOSIS — M25512 Pain in left shoulder: Secondary | ICD-10-CM | POA: Diagnosis not present

## 2019-03-11 DIAGNOSIS — M6281 Muscle weakness (generalized): Secondary | ICD-10-CM | POA: Diagnosis not present

## 2019-03-11 DIAGNOSIS — M25512 Pain in left shoulder: Secondary | ICD-10-CM | POA: Diagnosis not present

## 2019-04-27 DIAGNOSIS — N183 Chronic kidney disease, stage 3 (moderate): Secondary | ICD-10-CM | POA: Diagnosis not present

## 2019-04-27 DIAGNOSIS — I10 Essential (primary) hypertension: Secondary | ICD-10-CM | POA: Diagnosis not present

## 2019-04-27 DIAGNOSIS — R739 Hyperglycemia, unspecified: Secondary | ICD-10-CM | POA: Diagnosis not present

## 2019-04-27 DIAGNOSIS — R634 Abnormal weight loss: Secondary | ICD-10-CM | POA: Diagnosis not present

## 2019-04-27 DIAGNOSIS — K210 Gastro-esophageal reflux disease with esophagitis: Secondary | ICD-10-CM | POA: Diagnosis not present

## 2019-04-28 DIAGNOSIS — F5221 Male erectile disorder: Secondary | ICD-10-CM | POA: Diagnosis not present

## 2019-04-28 DIAGNOSIS — M4800 Spinal stenosis, site unspecified: Secondary | ICD-10-CM | POA: Diagnosis not present

## 2019-04-28 DIAGNOSIS — N401 Enlarged prostate with lower urinary tract symptoms: Secondary | ICD-10-CM | POA: Diagnosis not present

## 2019-04-28 DIAGNOSIS — N183 Chronic kidney disease, stage 3 (moderate): Secondary | ICD-10-CM | POA: Diagnosis not present

## 2019-04-28 DIAGNOSIS — Z6827 Body mass index (BMI) 27.0-27.9, adult: Secondary | ICD-10-CM | POA: Diagnosis not present

## 2019-04-28 DIAGNOSIS — Z23 Encounter for immunization: Secondary | ICD-10-CM | POA: Diagnosis not present

## 2019-04-28 DIAGNOSIS — I10 Essential (primary) hypertension: Secondary | ICD-10-CM | POA: Diagnosis not present

## 2019-04-28 DIAGNOSIS — M40295 Other kyphosis, thoracolumbar region: Secondary | ICD-10-CM | POA: Diagnosis not present

## 2019-05-04 DIAGNOSIS — H02831 Dermatochalasis of right upper eyelid: Secondary | ICD-10-CM | POA: Diagnosis not present

## 2019-05-04 DIAGNOSIS — H02834 Dermatochalasis of left upper eyelid: Secondary | ICD-10-CM | POA: Diagnosis not present

## 2019-05-04 DIAGNOSIS — H04123 Dry eye syndrome of bilateral lacrimal glands: Secondary | ICD-10-CM | POA: Diagnosis not present

## 2019-05-04 DIAGNOSIS — Z961 Presence of intraocular lens: Secondary | ICD-10-CM | POA: Diagnosis not present

## 2019-05-17 DIAGNOSIS — M48062 Spinal stenosis, lumbar region with neurogenic claudication: Secondary | ICD-10-CM | POA: Diagnosis not present

## 2019-05-17 DIAGNOSIS — M5136 Other intervertebral disc degeneration, lumbar region: Secondary | ICD-10-CM | POA: Diagnosis not present

## 2019-05-17 DIAGNOSIS — M4316 Spondylolisthesis, lumbar region: Secondary | ICD-10-CM | POA: Diagnosis not present

## 2019-05-17 DIAGNOSIS — M5416 Radiculopathy, lumbar region: Secondary | ICD-10-CM | POA: Diagnosis not present

## 2019-05-17 DIAGNOSIS — M519 Unspecified thoracic, thoracolumbar and lumbosacral intervertebral disc disorder: Secondary | ICD-10-CM | POA: Diagnosis not present

## 2019-05-17 DIAGNOSIS — M545 Low back pain: Secondary | ICD-10-CM | POA: Diagnosis not present

## 2019-05-28 DIAGNOSIS — I10 Essential (primary) hypertension: Secondary | ICD-10-CM | POA: Diagnosis not present

## 2019-05-28 DIAGNOSIS — K219 Gastro-esophageal reflux disease without esophagitis: Secondary | ICD-10-CM | POA: Diagnosis not present

## 2019-06-15 DIAGNOSIS — H02831 Dermatochalasis of right upper eyelid: Secondary | ICD-10-CM | POA: Diagnosis not present

## 2019-06-15 DIAGNOSIS — Z961 Presence of intraocular lens: Secondary | ICD-10-CM | POA: Diagnosis not present

## 2019-06-15 DIAGNOSIS — H02834 Dermatochalasis of left upper eyelid: Secondary | ICD-10-CM | POA: Diagnosis not present

## 2019-06-15 DIAGNOSIS — H04123 Dry eye syndrome of bilateral lacrimal glands: Secondary | ICD-10-CM | POA: Diagnosis not present

## 2019-06-28 DIAGNOSIS — I10 Essential (primary) hypertension: Secondary | ICD-10-CM | POA: Diagnosis not present

## 2019-06-28 DIAGNOSIS — K219 Gastro-esophageal reflux disease without esophagitis: Secondary | ICD-10-CM | POA: Diagnosis not present

## 2019-08-11 DIAGNOSIS — N183 Chronic kidney disease, stage 3 unspecified: Secondary | ICD-10-CM | POA: Diagnosis not present

## 2019-08-11 DIAGNOSIS — Z6827 Body mass index (BMI) 27.0-27.9, adult: Secondary | ICD-10-CM | POA: Diagnosis not present

## 2019-08-11 DIAGNOSIS — I10 Essential (primary) hypertension: Secondary | ICD-10-CM | POA: Diagnosis not present

## 2019-08-11 DIAGNOSIS — R251 Tremor, unspecified: Secondary | ICD-10-CM | POA: Diagnosis not present

## 2019-08-11 DIAGNOSIS — N401 Enlarged prostate with lower urinary tract symptoms: Secondary | ICD-10-CM | POA: Diagnosis not present

## 2019-08-19 ENCOUNTER — Ambulatory Visit (INDEPENDENT_AMBULATORY_CARE_PROVIDER_SITE_OTHER): Payer: Medicare Other | Admitting: Neurology

## 2019-08-19 ENCOUNTER — Other Ambulatory Visit: Payer: Self-pay

## 2019-08-19 ENCOUNTER — Encounter: Payer: Self-pay | Admitting: Neurology

## 2019-08-19 VITALS — BP 150/75 | HR 60 | Temp 97.1°F | Ht 69.0 in | Wt 191.3 lb

## 2019-08-19 DIAGNOSIS — G20 Parkinson's disease: Secondary | ICD-10-CM | POA: Diagnosis not present

## 2019-08-19 DIAGNOSIS — G20C Parkinsonism, unspecified: Secondary | ICD-10-CM

## 2019-08-19 MED ORDER — CARBIDOPA-LEVODOPA 25-100 MG PO TABS: ORAL_TABLET | ORAL | 3 refills | Status: DC

## 2019-08-19 NOTE — Progress Notes (Signed)
Subjective:    Patient ID: Gerald Knapp is a 86 y.o. male.  HPI     Star Age, MD, PhD [facility] [address], [address] [place], [place] [address]  Dear Dr. Quintin Alto,   I saw your patient, Gerald Knapp, upon your kind request in my neurologic clinic today for initial consultation of his tremor.  The patient is accompanied by his wife today.  As you know, Mr. [name] is an 86 year old right-handed gentleman with an underlying medical history of reflux disease, lumbar spinal stenosis with chronic low back pain, allergic rhinitis, and BPH, who reports a recent onset right more than left hand tremor, started in or around November 2020.  He reports it is primarily on the right side and happens primarily when he is sitting or when walking.  It does not seem to as noticeable when he holds something.  He does have some fine motor dyscontrol.  He has noticed difficulty swallowing at times, feels like he chokes on his own saliva even at times.  The choking-like sensation happens with liquids and solids.  He sleeps fairly well, he sleeps in a lift chair for the past year because of his low back pain.  He has seen Dr. Tonita Cong for this, he has also had steroid injections under Dr. Nelva Bush, about 3 or 4 altogether with limited success.  He is still active on his farm, they raise cows.  His daughter and son-in-law help on the farm, they stay on the farm.  He has no family history of tremor or Parkinson's disease.  Upon further asking, his wife has noticed change in his walking, he tends to shuffle at times and she has also noticed a more stooped posture.  He walks with a walking stick when he is outside on the farm.  He still uses his tractor and feeds the cattle.  Thankfully, he has not fallen.   His Past Medical History Is Significant For: Past Medical History:  Diagnosis Date  . Allergic rhinitis   . Benign prostate hyperplasia   . Chronic kidney disease   . GERD  (gastroesophageal reflux disease)   . Hypertension   . Tremor     His Past Surgical History Is Significant For: Past Surgical History:  Procedure Laterality Date  . CARDIAC CATHETERIZATION    . CATARACT EXTRACTION    . COLONOSCOPY    . HERNIA REPAIR    . NOSE SURGERY      His Family History Is Significant For: Family History  Problem Relation Age of Onset  . Arthritis Mother   . Hypertension Mother   . Cancer Father   . Diabetes Father   . Arthritis Father     His Social History Is Significant For: Social History   Socioeconomic History  . Marital status: Married    Spouse name: Lilly   . Number of children: Not on file  . Years of education: Not on file  . Highest education level: Not on file  Occupational History  . Not on file  Tobacco Use  . Smoking status: Never Smoker  . Smokeless tobacco: Never Used  Substance and Sexual Activity  . Alcohol use: Yes    Comment: Occastional Drink   . Drug use: Not on file  . Sexual activity: Not on file  Other Topics Concern  . Not on file  Social History Narrative  . Not on file   Social Determinants of Health   Financial Resource Strain:   .  Difficulty of Paying Living Expenses: Not on file  Food Insecurity:   . Worried About Charity fundraiser in the Last Year: Not on file  . Ran Out of Food in the Last Year: Not on file  Transportation Needs:   . Lack of Transportation (Medical): Not on file  . Lack of Transportation (Non-Medical): Not on file  Physical Activity:   . Days of Exercise per Week: Not on file  . Minutes of Exercise per Session: Not on file  Stress:   . Feeling of Stress : Not on file  Social Connections:   . Frequency of Communication with Friends and Family: Not on file  . Frequency of Social Gatherings with Friends and Family: Not on file  . Attends Religious Services: Not on file  . Active Member of Clubs or Organizations: Not on file  . Attends Archivist Meetings: Not on file   . Marital Status: Not on file    His Allergies Are:  No Known Allergies:   His Current Medications Are:  Outpatient Encounter Medications as of 08/19/2019  Medication Sig  . aspirin 81 MG chewable tablet Chew 81 mg by mouth daily.  . fluticasone (VERAMYST) 27.5 MCG/SPRAY nasal spray Place 2 sprays into the nose daily.  Marland Kitchen L-Lysine 500 MG CAPS Take by mouth.  . loratadine (CLARITIN) 10 MG tablet Take 10 mg by mouth daily.  . Probiotic Product (PROBIOTIC-10 PO) Take by mouth.  . terazosin (HYTRIN) 5 MG capsule Take 5 mg by mouth at bedtime.   No facility-administered encounter medications on file as of 08/19/2019.  :   Review of Systems:  Out of a complete 14 point review of systems, all are reviewed and negative with the exception of these symptoms as listed below: Review of Systems  Neurological:       Here for evaluation on tremor in bilateral hands. Right is worse than the left hand.     Objective:  Neurological Exam  Physical Exam Physical Examination:   Vitals:   08/19/19 0939  BP: (!) 150/75  Pulse: 60  Temp: (!) 97.1 F (36.2 C)    General Examination: The patient is a very pleasant 86 y.o. male in no acute distress. He appears well-developed and well-nourished and well groomed.   HEENT: Normocephalic, atraumatic, pupils are equal, round and reactive to light, Extraocular tracking is mildly impaired, he wears corrective eyeglasses, he is hard of hearing, no hearing aids.  Face is symmetric, mild facial masking noted, mild nuchal rigidity noted, no lip, neck or jaw tremor, airway examination reveals mild mouth dryness, no obvious dysarthria, perhaps mild hypophonia, no voice tremor, tongue protrudes centrally in palate elevates symmetrically. There are no carotid bruits on auscultation.   Chest: Clear to auscultation without wheezing, rhonchi or crackles noted.  Heart: S1+S2+0, regular and normal without murmurs, rubs or gallops noted.   Abdomen: Soft, non-tender  and non-distended with normal bowel sounds appreciated on auscultation.  Extremities: There is 1+ to 2+ pitting edema in the distal lower extremities bilaterally.   Skin: Warm and dry without trophic changes noted.  Musculoskeletal: exam reveals Arthritic changes in both hands, increase in lumbar kyphosis when he stands.  Neurologically:  Mental status: The patient is awake, alert and oriented in all 4 spheres. His immediate and remote memory, attention, language skills and fund of knowledge are appropriate. There is no evidence of aphasia, agnosia, apraxia or anomia. Speech is clear with normal prosody and enunciation. Thought process is linear.  Mood is normal and affect is normal.  Cranial nerves II - XII are as described above under HEENT exam. In addition: shoulder shrug is normal with equal shoulder height noted. Motor exam: Normal bulk, strength for age noted. There is no drift, or rebound. Romberg is Not tested for safety concerns.   On 08/19/2019: On Archimedes spiral drawing, he has mild insecurity with both hands but no obvious trembling noted, handwriting is legible, not particularly tremulous with the right hand but small. He has an intermittent resting tremor in the right upper extremity only.  The tremor is mild, at times moderate, especially with standing and walking.  He has a slight postural tremor in both upper extremities, no obvious action tremor, no intention tremor. Tone is increased on the right upper extremity with mild cogwheeling noted, slight increase in tone in the Left upper extremity without cogwheeling.  Overall mild bradykinesia noted.  Fine motor skills are mildly impaired to moderately impaired in the right upper and right lower extremities, minimal to mildly impaired on the left, he stands without difficulty but pushes himself up, posture is significantly stooped with increase in lumbar kyphosis noted.  He reports no significant back pain at this time.  He walks with  decreased stride length and decreased pace, impaired balance, decreased arm swing on the right.  He has some difficulty turning, did not bring his walking stick.   Assessment and Plan:   Assessment and Plan:  In summary, Sloane Pettaway is a very pleasant 86 y.o.-year old male with an underlying medical history of reflux disease, lumbar spinal stenosis with chronic low back pain, allergic rhinitis, and BPH, who Presents for evaluation of his tremor disorder.  He has on examination evidence of right-sided parkinsonism including a resting tremor but also mild fine motor dyscontrol and slowness and increase in tone.  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  I had a long discussion with the patient and his wife regarding his symptoms, his presentation and possible symptomatic treatment options.  He is encouraged to start a trial of Sinemet low-dose with gradual increase, I gave detailed written instructions and a new prescription. We talked about the importance of maintaining healthy lifestyle.  He is advised to continue to stay active mentally and physically but pace himself and also avoid any strenuous lifting or activities, he is cautioned regarding the use of a motor vehicle especially his tractor.  He is advised to stay better hydrated with water and well rested at night.  I plan to see him back routinely in 3 months, sooner if needed.  I answered all the questions today and the patient and his wife were in agreement. Thank you very much for allowing me to participate in the care of this nice patient. If I can be of any further assistance to you please do not hesitate to call me at [phone].  Sincerely,   Star Age, MD, PhD

## 2019-08-19 NOTE — Patient Instructions (Signed)
I think you have signs and symptoms of mild parkinsonism, possibly Right-sided predominant Parkinson's disease.   Please continue with Your healthy lifestyle, try to stay active mentally and physically.  Please try to hydrate better with water, try to get at least 6 cups of water per day. Use your walking stick for gait stability, be cautious when riding your tractor, please reassess your ability to use your tractor.  I do want to suggest a few things today: As discussed, I would like for you to start medication for symptom control.  Sinemet (generic name: carbidopa-levodopa) 25/100 mg: Take half a pill twice daily (8 AM and noon) for one week, then half a pill 3 times a day (8 AM, noon, and 4 PM) for one week, then one pill 3 times a day thereafter. Please try to take the medication away from you mealtimes, that is, ideally either one hour before or 2 hours after your meal to ensure optimal absorption. The medication can interfere with the protein content of your meal and trying to the protein in your food and therefore not get fully absorbed.  Common side effects reported are: Nausea, vomiting, sedation, confusion, lightheadedness. Rare side effects include hallucinations, severe nausea or vomiting, diarrhea and significant drop in blood pressure especially when going from lying to standing or from sitting to standing.   I would like to see you back in 3 months, sooner if we need to. Please call us with any interim questions, concerns, problems, updates or refill requests.  Our phone number is [phone]. We also have an after hours call service for urgent matters and there is a physician on-call for urgent questions, that cannot wait till the next work day. For any emergencies you know to call 911 or go to the nearest emergency room.

## 2019-08-27 DIAGNOSIS — N401 Enlarged prostate with lower urinary tract symptoms: Secondary | ICD-10-CM | POA: Diagnosis not present

## 2019-08-27 DIAGNOSIS — N183 Chronic kidney disease, stage 3 unspecified: Secondary | ICD-10-CM | POA: Diagnosis not present

## 2019-08-30 ENCOUNTER — Telehealth: Payer: Self-pay | Admitting: Neurology

## 2019-08-30 NOTE — Telephone Encounter (Signed)
I reached out to the pt. He sts on Friday he had a sharp pain in his chest along with dizziness. Pt reports today he had another episode today of chest discomfort and dizziness. Pt sts the pain today was not as severe and BP 147/79 and HR 69. Pt believes these symptoms could be related to his Sinemet or Terazosin. Pt was was encourage to call PCP and discuss if Terazosin could be causing these symptoms. Pt was also encourage if he has another on set of chest pain and feels worse than before to seek care at the nearest ED/ Urgent care to rule out cardiac dysfunction. Pt was agreeable but wanted to know if Dr. Rexene Alberts thought the sinement could be causing the chest pain/dizziness? Pt reports he is up to 1/2 pill tid.   Pt was advised I would call him back with Dr. Guadelupe Sabin response.

## 2019-08-30 NOTE — Telephone Encounter (Signed)
Sinemet typically does not cause any chest pain.  It can cause feeling of dizziness and low blood pressure values.  I would favor that he get checked out by his primary care physician as soon as possible or if he has an episode of chest pain that he proceed to the ER immediately.

## 2019-08-30 NOTE — Telephone Encounter (Signed)
I reached out to the pt and advised of Dr. Guadelupe Sabin recommendation. Pt verbalized understanding.

## 2019-08-30 NOTE — Telephone Encounter (Signed)
Pt has called to report that since he has been on carbidopa-levodopa (SINEMET IR) 25-100 MG tablet he has experienced, a hard chest pain on Friday when he went on his tractor, pt has had dizziness, pulse rate and blood pressure has dropped.  Pt feels he may not be able to stay on this medication, please call

## 2019-09-02 DIAGNOSIS — Z23 Encounter for immunization: Secondary | ICD-10-CM | POA: Diagnosis not present

## 2019-09-22 DIAGNOSIS — K2100 Gastro-esophageal reflux disease with esophagitis, without bleeding: Secondary | ICD-10-CM | POA: Diagnosis not present

## 2019-09-22 DIAGNOSIS — R739 Hyperglycemia, unspecified: Secondary | ICD-10-CM | POA: Diagnosis not present

## 2019-09-22 DIAGNOSIS — N183 Chronic kidney disease, stage 3 unspecified: Secondary | ICD-10-CM | POA: Diagnosis not present

## 2019-09-22 DIAGNOSIS — I10 Essential (primary) hypertension: Secondary | ICD-10-CM | POA: Diagnosis not present

## 2019-09-24 DIAGNOSIS — N183 Chronic kidney disease, stage 3 unspecified: Secondary | ICD-10-CM | POA: Diagnosis not present

## 2019-09-24 DIAGNOSIS — I129 Hypertensive chronic kidney disease with stage 1 through stage 4 chronic kidney disease, or unspecified chronic kidney disease: Secondary | ICD-10-CM | POA: Diagnosis not present

## 2019-09-28 DIAGNOSIS — R634 Abnormal weight loss: Secondary | ICD-10-CM | POA: Diagnosis not present

## 2019-09-28 DIAGNOSIS — I10 Essential (primary) hypertension: Secondary | ICD-10-CM | POA: Diagnosis not present

## 2019-09-28 DIAGNOSIS — M4800 Spinal stenosis, site unspecified: Secondary | ICD-10-CM | POA: Diagnosis not present

## 2019-09-28 DIAGNOSIS — N401 Enlarged prostate with lower urinary tract symptoms: Secondary | ICD-10-CM | POA: Diagnosis not present

## 2019-09-28 DIAGNOSIS — F5221 Male erectile disorder: Secondary | ICD-10-CM | POA: Diagnosis not present

## 2019-09-28 DIAGNOSIS — M40295 Other kyphosis, thoracolumbar region: Secondary | ICD-10-CM | POA: Diagnosis not present

## 2019-09-28 DIAGNOSIS — Z6836 Body mass index (BMI) 36.0-36.9, adult: Secondary | ICD-10-CM | POA: Diagnosis not present

## 2019-10-01 DIAGNOSIS — Z23 Encounter for immunization: Secondary | ICD-10-CM | POA: Diagnosis not present

## 2019-11-11 IMAGING — CT CT L SPINE W/ CM
1 of 9 series · 2 of 14 positions shown, 3 images · non-contrast
Comparison: Outside MRI 02/28/2018

CLINICAL DATA: Low back pain.  Spinal stenosis.
TECHNIQUE: Contiguous axial images were obtained through the Lumbar spine after
the intrathecal infusion of infusion. Coronal and sagittal
reconstructions were obtained of the axial image sets.

[Series 6: l spine bone · axial · 0.35mm/px · z∈[-278,-192]mm · 2 of 87 slices shown, 3 images]
[im 29/87  soft-tissue]
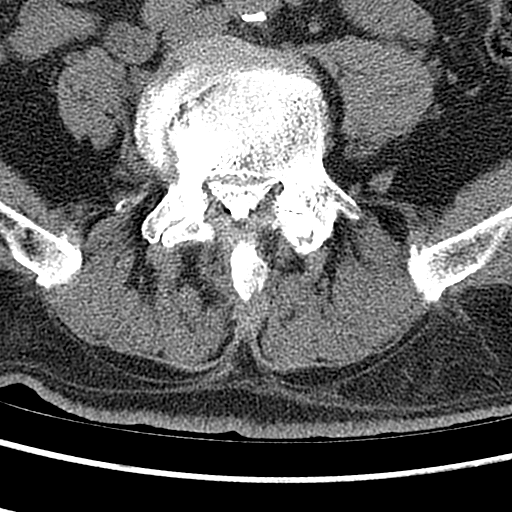
[im 29/87  bone]
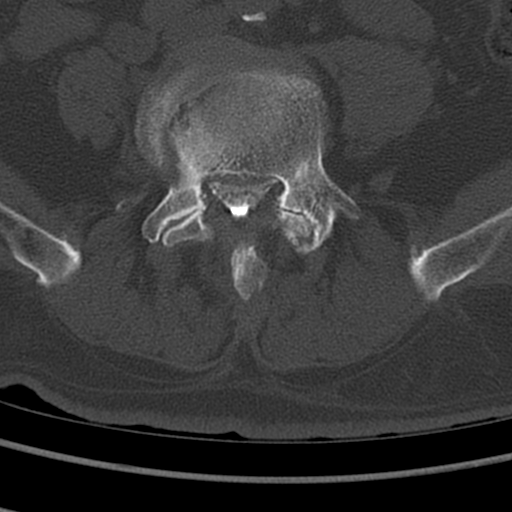
[im 58/87  bone]
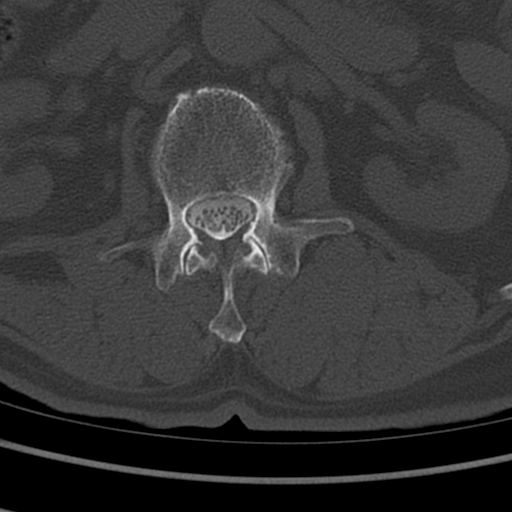

[2 of 14 positions shown; findings below may reference images not displayed]

EXAM:
LUMBAR MYELOGRAM

FLUOROSCOPY TIME:  40 seconds corresponding to a Dose Area Product
of 307 Gy*m2

PROCEDURE:
After thorough discussion of risks and benefits of the procedure
including bleeding, infection, injury to nerves, blood vessels,
adjacent structures as well as headache and CSF leak, written and
oral informed consent was obtained. Consent was obtained by Dr. Deecon
Esqueda. Time out form was completed.

Patient was positioned prone on the fluoroscopy table. Local
anesthesia was provided with 1% lidocaine without epinephrine after
prepped and draped in the usual sterile fashion. Puncture was
performed at L4-5 using a 3 1/2 inch 22-gauge spinal needle via
midline approach. Using a single pass through the dura, the needle
was placed within the thecal sac, with return of clear CSF. 15 mL of
Isovue 9-EFF was injected into the thecal sac, with normal
opacification of the nerve roots and cauda equina consistent with
free flow within the subarachnoid space.

I personally performed the lumbar puncture and administered the
intrathecal contrast. I also personally supervised acquisition of
the myelogram images.
FINDINGS: LUMBAR MYELOGRAM FINDINGS:

Degenerative scoliosis of a mild nature convex RIGHT mid lumbar
region related to asymmetric loss of interspace height at L2-3 on
the LEFT. Lateral listhesis L3 on L4 rightward. Severe disc space
narrowing L2 through S1.. Moderate to severe stenosis L2-3 and L3-4.
Mild to moderate stenosis L4-5. Moderate stenosis L5-S1. BILATERAL
L3 through S1 neural impingement at the respective levels.

Upright radiographs demonstrated accentuated RIGHT-sided
subarticular zone narrowing at L4-5 with patient bending to the
RIGHT.

With patient recumbent there is 8 mm anterolisthesis L5 on S1
secondary to BILATERAL L5 spondylolysis. With patient upright
standing, there is 9 mm anterolisthesis in flexion, neutral, and
extension. Trace retrolisthesis at L2-3 and L1-2 is compensatory,
and unchanged between prone and upright radiographs.

CT LUMBAR MYELOGRAM FINDINGS:

Segmentation: Normal.

Alignment: 9 mm anterolisthesis L5-S1 related to BILATERAL L5
spondylolysis. Trace retrolisthesis L1-2 and L2-3 is compensatory. 5
mm lateral listhesis L3 on L4 to the RIGHT. Roughly 6 degrees
degenerative scoliosis convex RIGHT centered L2.

Vertebrae: No worrisome osseous lesion.BILATERAL L5 pars defects.
Marked endplate reactive changes.

Conus medullaris: Normal in size, signal, and location.

Paraspinal tissues: No evidence for hydronephrosis or paravertebral
mass.

Disc levels:

L1-L2: Trace retrolisthesis. Central and leftward protrusion with
posterior element hypertrophy. Mild to moderate stenosis. LEFT L1
and LEFT L2 neural impingement are likely.

L2-L3: Up to 2 mm retrolisthesis with osseous spurring. Asymmetric
loss of interspace height to the LEFT. Posterior element
hypertrophy. Moderate to severe stenosis. BILATERAL L2 and L3 neural
impingement.

L3-L4: Severe loss of interspace height. Osseous spurring with
superimposed central protrusion. Posterior element hypertrophy.
Moderate to severe stenosis. Lateral listhesis L3 on L4 of 5 mm.
BILATERAL L3 and L4 neural impingement.

L4-L5: Disc space narrowing with vacuum phenomenon. Posterior
element hypertrophy. Central protrusion with osseous spurring.
Moderate stenosis, with side to side impingement. BILATERAL L4 and
L5 neural impingement are likely.

L5-S1: 9 mm spondylolisthesis secondary to BILATERAL L5
spondylolysis. Moderate to severe stenosis. Posterior element
hypertrophy is superimposed. Uncovering of the disc with annular
bulge. Severe BILATERAL L5, moderate BILATERAL S1 neural
impingement.
IMPRESSION: LUMBAR MYELOGRAM IMPRESSION:

Multilevel spondylosis and stenosis L2-3 through L5-S1.

8-9 mm of anterolisthesis L5-S1 related to BILATERAL L5
spondylolysis. No significant dynamic instability with regard to
lateral flexion extension radiographs, but there is accentuation of
subarticular zone narrowing at L4-5 on the RIGHT with rightward
bending.

CT LUMBAR MYELOGRAM IMPRESSION:

Moderate to severe stenosis from L2-3 through L5-S1 is
multifactorial, related to posterior element hypertrophy, disc
material, and osseous spurring.

Subarticular zone and foraminal zone narrowing is pronounced at
L2-3, L3-4, L4-5, and L5-S1 as described above.

Any or all levels could contribute to the patient's symptoms,
although rightward predominant impingement is more likely
symptomatic at L3-4 and L4-5.

BILATERAL L5 spondylolysis with grade 1 9 mm L5-S1
spondylolisthesis. Compressive foraminal narrowing greater than
subarticular zone narrowing at this level, although both are
significant.

## 2019-11-17 ENCOUNTER — Other Ambulatory Visit: Payer: Self-pay

## 2019-11-17 ENCOUNTER — Ambulatory Visit (INDEPENDENT_AMBULATORY_CARE_PROVIDER_SITE_OTHER): Payer: Medicare Other | Admitting: Neurology

## 2019-11-17 ENCOUNTER — Encounter: Payer: Self-pay | Admitting: Neurology

## 2019-11-17 VITALS — BP 120/74 | HR 45 | Temp 97.1°F | Ht 68.0 in | Wt 188.0 lb

## 2019-11-17 DIAGNOSIS — G20 Parkinson's disease: Secondary | ICD-10-CM | POA: Diagnosis not present

## 2019-11-17 MED ORDER — CARBIDOPA-LEVODOPA 25-100 MG PO TABS: 1.0000 | ORAL_TABLET | Freq: Three times a day (TID) | ORAL | 3 refills | Status: DC

## 2019-11-17 NOTE — Patient Instructions (Signed)
It was good to see you again.  Your tremor has improved a little bit, I would like for you to continue with the Sinemet generic 1 pill 3 times daily at the current schedule.  I would like for you to follow-up in 6 months to see one of our nurse practitioners, we can consider increasing your Sinemet at the time if you continue to tolerated to 1 pill 4 times a day.  Please be mindful about constipation issues, use your cane when you walk for any distance, especially outside and try to hydrate a little better with water.

## 2019-11-17 NOTE — Progress Notes (Signed)
Subjective:    Patient ID: Gerald Knapp is a 86 y.o. male.  HPI     Interim history:   Gerald Knapp is an 86 year old right-handed gentleman with an underlying medical history of reflux disease, lumbar spinal stenosis with chronic low back pain, allergic rhinitis, and BPH, who presents for follow-up consultation of his right-sided parkinsonism.  The patient is accompanied by his wife again today.  I first met him on 08/19/2019 at the request of his primary care physician, at which time he reported a 2 to 37-monthhistory of tremor affecting his right more than left hand.  His exam was in keeping with parkinsonism with right-sided lateralization noted.  He was advised to start a trial of low-dose Sinemet with gradual titration.  His wife called in the interim in February 2021 reporting that he had chest pains and dizziness.  They were not sure if these symptoms came from taking Terazosin.  He was advised to follow-up with primary care physician or proceed to the emergency room should he have chest pain.    Today, 11/17/2019: He reports feeling better with regards to his tremor, he is able to tolerate Sinemet, 1 pill 3 times daily, first pill around 7, second pill between 2 and 3 and last pill around 10 PM.  He feels that his tremor is just a little bit better and his wife endorses that when he feeds himself he does not shake quite as much.  When he stopped the Terazosin his dizziness improved and he was able to take the Sinemet all along.  He has intermittent constipation, takes a stool softener on a daily basis.  He does not drink a whole lot of water, likes to drink tea or soda. No falls.   The patient's allergies, current medications, family history, past medical history, past social history, past surgical history and problem list were reviewed and updated as appropriate.   Previously:   08/19/19: (He) reports a recent onset right more than left hand tremor, started in or around November 2020.  He  reports it is primarily on the right side and happens primarily when he is sitting or when walking.  It does not seem to as noticeable when he holds something.  He does have some fine motor dyscontrol.  He has noticed difficulty swallowing at times, feels like he chokes on his own saliva even at times.  The choking-like sensation happens with liquids and solids.  He sleeps fairly well, he sleeps in a lift chair for the past year because of his low back pain.  He has seen Dr. BTonita Congfor this, he has also had steroid injections under Dr. RNelva Bush about 3 or 4 altogether with limited success.  He is still active on his farm, they raise cows.  His daughter and son-in-law help on the farm, they stay on the farm.  He has no family history of tremor or Parkinson's disease.  Upon further asking, his wife has noticed change in his walking, he tends to shuffle at times and she has also noticed a more stooped posture.  He walks with a walking stick when he is outside on the farm.  He still uses his tractor and feeds the cattle.  Thankfully, he has not fallen.   His Past Medical History Is Significant For: Past Medical History:  Diagnosis Date  . Allergic rhinitis   . Benign prostate hyperplasia   . Chronic kidney disease   . GERD (gastroesophageal reflux disease)   . Hypertension   .  Tremor     His Past Surgical History Is Significant For: Past Surgical History:  Procedure Laterality Date  . CARDIAC CATHETERIZATION    . CATARACT EXTRACTION    . COLONOSCOPY    . HERNIA REPAIR    . NOSE SURGERY      His Family History Is Significant For: Family History  Problem Relation Age of Onset  . Arthritis Mother   . Hypertension Mother   . Cancer Father   . Diabetes Father   . Arthritis Father     His Social History Is Significant For: Social History   Socioeconomic History  . Marital status: Married    Spouse name: Lilly   . Number of children: Not on file  . Years of education: Not on file  .  Highest education level: Not on file  Occupational History  . Not on file  Tobacco Use  . Smoking status: Never Smoker  . Smokeless tobacco: Never Used  Substance and Sexual Activity  . Alcohol use: Yes    Comment: Occastional Drink   . Drug use: Not on file  . Sexual activity: Not on file  Other Topics Concern  . Not on file  Social History Narrative  . Not on file   Social Determinants of Health   Financial Resource Strain:   . Difficulty of Paying Living Expenses:   Food Insecurity:   . Worried About Charity fundraiser in the Last Year:   . Arboriculturist in the Last Year:   Transportation Needs:   . Film/video editor (Medical):   Marland Kitchen Lack of Transportation (Non-Medical):   Physical Activity:   . Days of Exercise per Week:   . Minutes of Exercise per Session:   Stress:   . Feeling of Stress :   Social Connections:   . Frequency of Communication with Friends and Family:   . Frequency of Social Gatherings with Friends and Family:   . Attends Religious Services:   . Active Member of Clubs or Organizations:   . Attends Archivist Meetings:   Marland Kitchen Marital Status:     His Allergies Are:  No Known Allergies:   His Current Medications Are:  Outpatient Encounter Medications as of 11/17/2019  Medication Sig  . aspirin 81 MG chewable tablet Chew 81 mg by mouth daily.  . carbidopa-levodopa (SINEMET IR) 25-100 MG tablet Take 1/2 pill twice daily x 1 week, then 1/2 pill 3 times a day x 1 week, then 1 pill 3 times a day thereafter. (Patient taking differently: 1 pill 3 times a day thereafter.)  . fluticasone (VERAMYST) 27.5 MCG/SPRAY nasal spray Place 2 sprays into the nose daily.  Marland Kitchen loratadine (CLARITIN) 10 MG tablet Take 10 mg by mouth daily.  . Probiotic Product (PROBIOTIC-10 PO) Take by mouth.  . [DISCONTINUED] L-Lysine 500 MG CAPS Take by mouth.  . [DISCONTINUED] terazosin (HYTRIN) 5 MG capsule Take 5 mg by mouth at bedtime.   No facility-administered  encounter medications on file as of 11/17/2019.  :  Review of Systems:  Out of a complete 14 point review of systems, all are reviewed and negative with the exception of these symptoms as listed below: Review of Systems  Neurological:       3 month f/u on PD. Reports he has been tolerating the Sinemet well. Had to stop Terazosin due to increased dizzy sx.  Reports no falls since last visit but increased tightness in bilateral knees.  Objective:  Neurological Exam  Physical Exam Physical Examination:   Vitals:   11/17/19 0941  BP: 120/74  Pulse: (!) 45  Temp: (!) 97.1 F (36.2 C)    General Examination: The patient is a very pleasant 86 y.o. male in no acute distress. He appears well-developed and well-nourished and well groomed.   HEENT: Normocephalic, atraumatic, pupils are equal, round and reactive to light, Extraocular tracking is mildly impaired, he wears corrective eyeglasses, he is hard of hearing, no hearing aids.  Face is symmetric, mild facial masking noted, mild nuchal rigidity noted, no lip, neck or jaw tremor, airway examination reveals moderate mouth dryness, no obvious dysarthria, perhaps mild hypophonia, no voice tremor, tongue protrudes centrally in palate elevates symmetrically. There are no carotid bruits on auscultation.   Chest: Clear to auscultation without wheezing, rhonchi or crackles noted.  Heart: S1+S2+0, regular and normal without murmurs, rubs or gallops noted.   Abdomen: Soft, non-tender and non-distended with normal bowel sounds appreciated on auscultation.  Extremities: There is 1+ to 2+ pitting edema in the distal lower extremities bilaterally.   Skin: Warm and dry without trophic changes noted.  Musculoskeletal: exam reveals Arthritic changes in both hands, increase in lumbar kyphosis when he stands.  Neurologically:  Mental status: The patient is awake, alert and oriented in all 4 spheres. His immediate and remote memory,  attention, language skills and fund of knowledge are appropriate. There is no evidence of aphasia, agnosia, apraxia or anomia. Speech is clear with normal prosody and enunciation. Thought process is linear. Mood is normal and affect is normal.  Cranial nerves II - XII are as described above under HEENT exam. In addition: shoulder shrug is normal with equal shoulder height noted. Motor exam: Normal bulk, strength for age noted. There is no drift, or rebound. Romberg is Not tested for safety concerns.   (On 08/19/2019: On Archimedes spiral drawing, he has mild insecurity with both hands but no obvious trembling noted, handwriting is legible, not particularly tremulous with the right hand but small.)  He has a mild resting tremor in the right upper extremity only. The tremor is mild and intermittent, has a slight postural tremor in both upper extremities, no obvious action tremor, no intention tremor. Tone is increased on the right upper extremity with mild cogwheeling noted, normal tone in the LUE. Overall mild bradykinesia noted.  Fine motor skills are mildly impaired to moderately impaired in the right upper and right lower extremities, minimal to mildly impaired on the left. He stands without difficulty but pushes himself up, posture is significantly stooped with increase in lumbar kyphosis noted.  He reports no significant back pain at this time.  He walks with decreased stride length and decreased pace, impaired balance, decreased arm swing on the right.  He has some difficulty turning, uses a cane.    Assessment and Plan:   In summary, Gerald Knapp is a very pleasant 86 year old male with an underlying medical history of reflux disease, lumbar spinal stenosis with chronic low back pain, allergic rhinitis, and BPH, who presents for FU consultation of his tremor disorder, with Hx and exam in keeping with right-sided Parkinson's disease. He has been on Sinemet since January 2021 with tolerance and  improvement in the tremor noted by pt and on exam. He had dizziness in the interim, deemed secondary to terazosin.  His dizziness improved after he came off of it.  He is advised to continue with Sinemet 1 pill 3 times daily for now.  He is advised to follow-up to see one of our nurse practitioners routinely in about 6 months.  At the time, if he is agreeable, we can consider increasing the Sinemet to 1 pill 4 times daily, such as 7, 11, 3 PM and 7 PM.  For now, we mutually agreed to keep his regimen stable.  We talked about the importance of good nutrition, good hydration with water and being proactive about constipation issues.  I answered all the questions today and the patient and his wife were in agreement with the plan. I spent 25 minutes in total face-to-face time and in reviewing records during pre-charting, more than 50% of which was spent in counseling and coordination of care, reviewing test results, reviewing medications and treatment regimen and/or in discussing or reviewing the diagnosis of PD, the prognosis and treatment options. Pertinent laboratory and imaging test results that were available during this visit with the patient were reviewed by me and considered in my medical decision making (see chart for details).

## 2020-01-04 DIAGNOSIS — M79604 Pain in right leg: Secondary | ICD-10-CM | POA: Diagnosis not present

## 2020-01-04 DIAGNOSIS — M79605 Pain in left leg: Secondary | ICD-10-CM | POA: Diagnosis not present

## 2020-02-22 DIAGNOSIS — D0462 Carcinoma in situ of skin of left upper limb, including shoulder: Secondary | ICD-10-CM | POA: Diagnosis not present

## 2020-02-22 DIAGNOSIS — D044 Carcinoma in situ of skin of scalp and neck: Secondary | ICD-10-CM | POA: Diagnosis not present

## 2020-02-22 DIAGNOSIS — C44212 Basal cell carcinoma of skin of right ear and external auricular canal: Secondary | ICD-10-CM | POA: Diagnosis not present

## 2020-02-22 DIAGNOSIS — L570 Actinic keratosis: Secondary | ICD-10-CM | POA: Diagnosis not present

## 2020-02-22 DIAGNOSIS — D485 Neoplasm of uncertain behavior of skin: Secondary | ICD-10-CM | POA: Diagnosis not present

## 2020-03-02 DIAGNOSIS — C44212 Basal cell carcinoma of skin of right ear and external auricular canal: Secondary | ICD-10-CM | POA: Diagnosis not present

## 2020-03-02 DIAGNOSIS — C44629 Squamous cell carcinoma of skin of left upper limb, including shoulder: Secondary | ICD-10-CM | POA: Diagnosis not present

## 2020-03-02 DIAGNOSIS — C4442 Squamous cell carcinoma of skin of scalp and neck: Secondary | ICD-10-CM | POA: Diagnosis not present

## 2020-03-23 ENCOUNTER — Encounter: Payer: Self-pay | Admitting: Family Medicine

## 2020-03-23 ENCOUNTER — Ambulatory Visit (INDEPENDENT_AMBULATORY_CARE_PROVIDER_SITE_OTHER): Payer: Medicare Other | Admitting: Family Medicine

## 2020-03-23 VITALS — BP 122/71 | HR 64 | Ht 68.0 in | Wt 180.6 lb

## 2020-03-23 DIAGNOSIS — G20 Parkinson's disease: Secondary | ICD-10-CM

## 2020-03-23 MED ORDER — CARBIDOPA-LEVODOPA 25-100 MG PO TABS: 1.0000 | ORAL_TABLET | Freq: Four times a day (QID) | ORAL | 3 refills | Status: DC

## 2020-03-23 NOTE — Patient Instructions (Addendum)
We will increase Sinemet to 4 tablets daily. I recommend taking this medication around 7am, 11am, 2pm and 7pm.   Continue to monitor swallowing. Let me know if you feel symptoms are worsening.   Follow up in 6 months   Parkinson's Disease Parkinson's disease causes problems with movements. It is a long-term condition. It gets worse over time (is progressive). It affects each person in different ways. It makes it harder for you to:  Control how your body moves.  Move your body normally. The condition can range from mild to very bad (advanced). What are the causes? This condition results from a loss of brain cells called neurons. These brain cells make a chemical called dopamine, which is needed to control body movement. As the condition gets worse, the brain cells make less dopamine. This makes it hard to move or control your movements. The exact cause of this condition is not known. What increases the risk?  Being male.  Being age 95 or older.  Having family members who had Parkinson's disease.  Having had an injury to the brain.  Being very sad (depressed).  Being around things that are harmful or poisonous. What are the signs or symptoms? Symptoms of this condition can vary. The main symptoms have to do with movement. These include:  A tremor or shaking while you are resting that you cannot control.  Stiffness in your neck, arms, and legs.  Slowing of movement. This may include: ? Losing expressions of the face. ? Having trouble making small movements that are needed to button your clothing or brush your teeth.  Walking in a way that is not normal. You may walk with short, shuffling steps.  Loss of balance when standing. You may sway, fall backward, or have trouble making turns. Other symptoms include:  Being very sad, worried, or confused.  Seeing or hearing things that are not real.  Losing thinking abilities (dementia).  Trouble speaking or  swallowing.  Having a hard time pooping (constipation).  Needing to pee right away, peeing often, or not being able to control when you pee or poop.  Sleep problems. How is this treated? There is no cure. The goal of treatment is to manage your symptoms. Treatment may include:  Medicines.  Therapy to help with talking or movement.  Surgery to reduce shaking and other movements that you cannot control. Follow these instructions at home: Medicines  Take over-the-counter and prescription medicines only as told by your doctor.  Avoid taking pain or sleeping medicines. Eating and drinking  Follow instructions from your doctor about what you cannot eat or drink.  Do not drink alcohol. Activity  Talk with your doctor about if it is safe for you to drive.  Do exercises as told by your doctor. Lifestyle      Put in grab bars and railings in your home. These help to prevent falls.  Do not use any products that contain nicotine or tobacco, such as cigarettes, e-cigarettes, and chewing tobacco. If you need help quitting, ask your doctor.  Join a support group. General instructions  Talk with your doctor about what you need help with and what your safety needs are.  Keep all follow-up visits as told by your doctor, including any therapy visits to help with talking or moving. This is important. Contact a doctor if:  Medicines do not help your symptoms.  You feel off-balance.  You fall at home.  You need more help at home.  You have trouble  swallowing.  You have a very hard time pooping.  You have a lot of side effects from your medicines.  You feel very sad, worried, or confused. Get help right away if:  You were hurt in a fall.  You see or hear things that are not real.  You cannot swallow without choking.  You have chest pain or trouble breathing.  You do not feel safe at home.  You have thoughts about hurting yourself or others. If you ever feel like  you may hurt yourself or others, or have thoughts about taking your own life, get help right away. You can go to your nearest emergency department or call:  Your local emergency services (911 in the [place]  A suicide crisis helpline, such as the [place] at 641 741 6361. This is open 24 hours a day. Summary  This condition causes problems with movements.  It is a long-term condition. It gets worse over time.  There is no cure. Treatment focuses on managing your symptoms.  Talk with your doctor about what you need help with and what your safety needs are.  Keep all follow-up visits as told by your doctor. This is important. This information is not intended to replace advice given to you by your health care provider. Make sure you discuss any questions you have with your health care provider. Document Revised: 10/01/2018 Document Reviewed: 10/01/2018 Elsevier Patient Education  [place].

## 2020-03-23 NOTE — Progress Notes (Addendum)
PATIENT: Gerald Knapp DOB: February 20, 1931  REASON FOR VISIT: follow up HISTORY FROM: patient  Chief Complaint  Patient presents with  . Follow-up    4 month f/u for PD, has noticed an increase in tremors in right arm. Left arm is occasionally tremor as well.   . room 1    with wife      HISTORY OF PRESENT ILLNESS: Today 03/23/20 Gerald Knapp is a 86 y.o. male here today for follow up for Parkinson's Disease. He continues Sinemet 1 tablet TID. Tremor has worsened slightly over the past 4 months. He feels tremor and tension more in right forearm. He is tolerating Sinemet well with no adverse effects noted. He does continue to notice more saliva production and feels he gets strangled easily. He denies any difficulty eating or swallowing foods/liquids. He feels that he has more drainage from seasonal allergies and that he gets a little strangled on drainage and saliva. He will sip on a glass of amaretto throughout the day and this prevents him from getting strangled. He feels gait is stable. 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 He did get tripped up in his tomato patch and fell over. Fortunately, no injuries. He uses a walking stick or cane everywhere he goes. He feels that he is eating well but has lost about 11 pounds since last being seen. He feels this is related to his wife no longer cooking full meals like she used to. His wife reports that he has had a decreased appetite recently. He is drinking a high protein Boost drink every day.    HISTORY: (copied from Dr Guadelupe Sabin note on 11/17/2019)  Mr. Lauderback is an 86 year old right-handed gentleman with an underlying medical history of reflux disease, lumbar spinal stenosis with chronic low back pain, allergic rhinitis, and BPH, who presents for follow-up consultation of his right-sided parkinsonism.  The patient is accompanied by his wife again today.  I first met him on 08/19/2019 at the request of his primary care physician,  at which time he reported a 2 to 30-month history of tremor affecting his right more than left hand.  His exam was in keeping with parkinsonism with right-sided lateralization noted.  He was advised to start a trial of low-dose Sinemet with gradual titration.  His wife called in the interim in February 2021 reporting that he had chest pains and dizziness.  They were not sure if these symptoms came from taking Terazosin.  He was advised to follow-up with primary care physician or proceed to the emergency room should he have chest pain.    Today, 11/17/2019: He reports feeling better with regards to his tremor, he is able to tolerate Sinemet, 1 pill 3 times daily, first pill around 7, second pill between 2 and 3 and last pill around 10 PM.  He feels that his tremor is just a little bit better and his wife endorses that when he feeds himself he does not shake quite as much.  When he stopped the Terazosin his dizziness improved and he was able to take the Sinemet all along.  He has intermittent constipation, takes a stool softener on a daily basis.  He does not drink a whole lot of water, likes to drink tea or soda. No falls.   The patient's allergies, current medications, family history, past medical history, past social history, past surgical history and problem list were reviewed and updated as appropriate.   Previously:   08/19/19: (  He) reports a recent onset right more than left hand tremor, started in or around November 2020. He reports it is primarily on the right side and happens primarily when he is sitting or when walking. It does not seem to as noticeable when he holds something. He does have some fine motor dyscontrol. He has noticed difficulty swallowing at times, feels like he chokes on his own saliva even at times. The choking-like sensation happens with liquids and solids. He sleeps fairly well, he sleeps in a lift chair for the past year because of his low back pain. He has seen Dr.  Tonita Cong for this, he has also had steroid injections under Dr. Nelva Bush, about 3 or 4 altogether with limited success. 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 He has no family history of tremor or Parkinson's disease. Upon further asking, his wife has noticed change in his walking, he tends to shuffle at times and she has also noticed a more stooped posture. He walks with a walking stick when he is outside on the farm. He still uses his tractor and feeds the cattle. Thankfully, he has not fallen.    REVIEW OF SYSTEMS: Out of a complete 14 system review of symptoms, the patient complains only of the following symptoms, tremor, decreased appetite, and all other reviewed systems are negative.   ALLERGIES: No Known Allergies  HOME MEDICATIONS: Outpatient Medications Prior to Visit  Medication Sig Dispense Refill  . aspirin 81 MG chewable tablet Chew 81 mg by mouth daily.    . fluticasone (VERAMYST) 27.5 MCG/SPRAY nasal spray Place 2 sprays into the nose daily.    Marland Kitchen loratadine (CLARITIN) 10 MG tablet Take 10 mg by mouth daily.    . Probiotic Product (PROBIOTIC-10 PO) Take by mouth.    . traMADol (ULTRAM) 50 MG tablet     . carbidopa-levodopa (SINEMET IR) 25-100 MG tablet Take 1 tablet by mouth 3 (three) times daily. 270 tablet 3   No facility-administered medications prior to visit.    PAST MEDICAL HISTORY: Past Medical History:  Diagnosis Date  . Allergic rhinitis   . Benign prostate hyperplasia   . Chronic kidney disease   . GERD (gastroesophageal reflux disease)   . Hypertension   . Tremor     PAST SURGICAL HISTORY: Past Surgical History:  Procedure Laterality Date  . CARDIAC CATHETERIZATION    . CATARACT EXTRACTION    . COLONOSCOPY    . HERNIA REPAIR    . NOSE SURGERY      FAMILY HISTORY: Family History  Problem Relation Age of Onset  . Arthritis Mother   . Hypertension Mother   . Cancer  Father   . Diabetes Father   . Arthritis Father     SOCIAL HISTORY: Social History   Socioeconomic History  . Marital status: Married    Spouse name: Lilly   . Number of children: Not on file  . Years of education: Not on file  . Highest education level: Not on file  Occupational History  . Not on file  Tobacco Use  . Smoking status: Never Smoker  . Smokeless tobacco: Never Used  Substance and Sexual Activity  . Alcohol use: Yes    Comment: Occastional Drink   . Drug use: Not on file  . Sexual activity: Not on file  Other Topics Concern  . Not on file  Social History Narrative  .  Not on file   Social Determinants of Health   Financial Resource Strain:   . Difficulty of Paying Living Expenses: Not on file  Food Insecurity:   . Worried About Charity fundraiser in the Last Year: Not on file  . Ran Out of Food in the Last Year: Not on file  Transportation Needs:   . Lack of Transportation (Medical): Not on file  . Lack of Transportation (Non-Medical): Not on file  Physical Activity:   . Days of Exercise per Week: Not on file  . Minutes of Exercise per Session: Not on file  Stress:   . Feeling of Stress : Not on file  Social Connections:   . Frequency of Communication with Friends and Family: Not on file  . Frequency of Social Gatherings with Friends and Family: Not on file  . Attends Religious Services: Not on file  . Active Member of Clubs or Organizations: Not on file  . Attends Archivist Meetings: Not on file  . Marital Status: Not on file  Intimate Partner Violence:   . Fear of Current or Ex-Partner: Not on file  . Emotionally Abused: Not on file  . Physically Abused: Not on file  . Sexually Abused: Not on file      PHYSICAL EXAM  Vitals:   03/23/20 1015  BP: 122/71  Pulse: 64  Weight: 180 lb 9.6 oz (81.9 kg)  Height: 5\' 8"  (1.727 m)   Body mass index is 27.46 kg/m.  Generalized: Well developed, in no acute distress  Cardiology:  normal rate and rhythm, no murmur noted Respiratory: clear to auscultation bilaterally  Neurological examination  Mentation: Alert oriented to time, place, history taking. Follows all commands speech and language fluent Cranial nerve II-XII: Pupils were equal round reactive to light. Extraocular movements were full, visual field were full on confrontational test. Facial sensation and strength were normal. Uvula tongue midline. Head turning and shoulder shrug  were normal and symmetric. Motor: The motor testing reveals 5 over 5 strength of all 4 extremities. Good symmetric motor tone is noted throughout. Resting tremor notes of right hand  Sensory: Sensory testing is intact to soft touch on all 4 extremities. No evidence of extinction is noted.  Coordination: Cerebellar testing reveals good finger-nose-finger and heel-to-shin bilaterally.  Gait and station: Gait is short, stable with cane, stooped posture Reflexes: Deep tendon reflexes are symmetric and normal bilaterally.   DIAGNOSTIC DATA (LABS, IMAGING, TESTING) - I reviewed patient records, labs, notes, testing and imaging myself where available.  No flowsheet data found.   No results found for: WBC, HGB, HCT, MCV, PLT No results found for: NA, K, CL, CO2, GLUCOSE, BUN, CREATININE, CALCIUM, PROT, ALBUMIN, AST, ALT, ALKPHOS, BILITOT, GFRNONAA, GFRAA No results found for: CHOL, HDL, LDLCALC, LDLDIRECT, TRIG, CHOLHDL No results found for: HGBA1C No results found for: VITAMINB12 No results found for: TSH     ASSESSMENT AND PLAN 86 y.o. year old male  has a past medical history of Allergic rhinitis, Benign prostate hyperplasia, Chronic kidney disease, GERD (gastroesophageal reflux disease), Hypertension, and Tremor. here with     ICD-10-CM   1. Parkinson's disease ([place])  G20 carbidopa-levodopa (SINEMET IR) 25-100 MG tablet    DISCONTINUED: carbidopa-levodopa (SINEMET IR) 25-100 MG tablet    Glendell Docker is doing well, overall. He continues to  tolerate Sinemet. We will increase dosage to 1 tablet 4 times daily as directed. He was educated on appropriate timing of medication dosing. He will continue to  monitor concerns with increased saliva production and difficulty swallowing. We have offered a swallowing eval but he states symptoms have nearly resolved with drinking a small glass of amaretto during the day. He does have significant allergies. He was advised to consider a trial of Zyrtec in place of Claritin. He was encouraged to discuss with PCP. I have asked his wife to call me with any worsening symptoms. He will continue regular activity. Fall precautions reviewed. He will focus on healthy lifestyle habits. He will monitor weight and call for continued unintended weight loss. He will follow up with me in 6 months, sooner if needed.    No orders of the defined types were placed in this encounter.    Meds ordered this encounter  Medications  . DISCONTD: carbidopa-levodopa (SINEMET IR) 25-100 MG tablet    Sig: Take 1 tablet by mouth 4 (four) times daily.    Dispense:  360 tablet    Refill:  3    Order Specific Question:   Supervising Provider    Answer:   Melvenia Beam [id number]  . carbidopa-levodopa (SINEMET IR) 25-100 MG tablet    Sig: Take 1 tablet by mouth 4 (four) times daily.    Dispense:  360 tablet    Refill:  3    Order Specific Question:   Supervising Provider    Answer:   Melvenia Beam [id number]      I spent 15 minutes with the patient. 50% of this time was spent counseling and educating patient on plan of care and medications.    Debbora Presto, FNP-C 03/23/2020, 12:18 PM [facility] [address], [place], [place] [address] [phone]  I reviewed the above note and documentation by the Nurse Practitioner and agree with the history, exam, assessment and plan as outlined above. I was available for consultation. Star Age, MD, PhD [facility])

## 2020-04-05 DIAGNOSIS — Z23 Encounter for immunization: Secondary | ICD-10-CM | POA: Diagnosis not present

## 2020-05-12 DIAGNOSIS — I10 Essential (primary) hypertension: Secondary | ICD-10-CM | POA: Diagnosis not present

## 2020-05-12 DIAGNOSIS — R739 Hyperglycemia, unspecified: Secondary | ICD-10-CM | POA: Diagnosis not present

## 2020-05-12 DIAGNOSIS — K2100 Gastro-esophageal reflux disease with esophagitis, without bleeding: Secondary | ICD-10-CM | POA: Diagnosis not present

## 2020-05-12 DIAGNOSIS — Z13220 Encounter for screening for lipoid disorders: Secondary | ICD-10-CM | POA: Diagnosis not present

## 2020-05-17 DIAGNOSIS — L570 Actinic keratosis: Secondary | ICD-10-CM | POA: Diagnosis not present

## 2020-05-17 DIAGNOSIS — M4800 Spinal stenosis, site unspecified: Secondary | ICD-10-CM | POA: Diagnosis not present

## 2020-05-17 DIAGNOSIS — F5221 Male erectile disorder: Secondary | ICD-10-CM | POA: Diagnosis not present

## 2020-05-17 DIAGNOSIS — N401 Enlarged prostate with lower urinary tract symptoms: Secondary | ICD-10-CM | POA: Diagnosis not present

## 2020-05-17 DIAGNOSIS — Z6827 Body mass index (BMI) 27.0-27.9, adult: Secondary | ICD-10-CM | POA: Diagnosis not present

## 2020-05-17 DIAGNOSIS — Z23 Encounter for immunization: Secondary | ICD-10-CM | POA: Diagnosis not present

## 2020-05-17 DIAGNOSIS — I10 Essential (primary) hypertension: Secondary | ICD-10-CM | POA: Diagnosis not present

## 2020-08-10 DIAGNOSIS — Z20828 Contact with and (suspected) exposure to other viral communicable diseases: Secondary | ICD-10-CM | POA: Diagnosis not present

## 2020-08-29 DIAGNOSIS — C44622 Squamous cell carcinoma of skin of right upper limb, including shoulder: Secondary | ICD-10-CM | POA: Diagnosis not present

## 2020-08-29 DIAGNOSIS — L570 Actinic keratosis: Secondary | ICD-10-CM | POA: Diagnosis not present

## 2020-08-29 DIAGNOSIS — D0461 Carcinoma in situ of skin of right upper limb, including shoulder: Secondary | ICD-10-CM | POA: Diagnosis not present

## 2020-09-18 DIAGNOSIS — I10 Essential (primary) hypertension: Secondary | ICD-10-CM | POA: Diagnosis not present

## 2020-09-18 DIAGNOSIS — Z6827 Body mass index (BMI) 27.0-27.9, adult: Secondary | ICD-10-CM | POA: Diagnosis not present

## 2020-09-18 DIAGNOSIS — R001 Bradycardia, unspecified: Secondary | ICD-10-CM | POA: Diagnosis not present

## 2020-09-18 DIAGNOSIS — J069 Acute upper respiratory infection, unspecified: Secondary | ICD-10-CM | POA: Diagnosis not present

## 2020-09-18 DIAGNOSIS — R42 Dizziness and giddiness: Secondary | ICD-10-CM | POA: Diagnosis not present

## 2020-09-26 ENCOUNTER — Ambulatory Visit (INDEPENDENT_AMBULATORY_CARE_PROVIDER_SITE_OTHER): Payer: Medicare Other | Admitting: Family Medicine

## 2020-09-26 ENCOUNTER — Encounter: Payer: Self-pay | Admitting: Family Medicine

## 2020-09-26 VITALS — BP 136/68 | HR 59 | Ht 68.0 in | Wt 184.0 lb

## 2020-09-26 DIAGNOSIS — G20 Parkinson's disease: Secondary | ICD-10-CM | POA: Diagnosis not present

## 2020-09-26 DIAGNOSIS — I499 Cardiac arrhythmia, unspecified: Secondary | ICD-10-CM

## 2020-09-26 DIAGNOSIS — R1310 Dysphagia, unspecified: Secondary | ICD-10-CM

## 2020-09-26 MED ORDER — CARBIDOPA-LEVODOPA ER 50-200 MG PO TBCR: 1.0000 | EXTENDED_RELEASE_TABLET | Freq: Every day | ORAL | 0 refills | Status: DC

## 2020-09-26 NOTE — Patient Instructions (Signed)
Below is our plan:  We will continue carbidopa-levodopa 25/100mg  four times daily. We will add carbidopa-levodopa CR 50/200mg  at dinner. This will help to settle down evening tremor. Please let me know if you have any side effects with medication.   Please make sure you are staying well hydrated. I recommend 50-60 ounces daily. Well balanced diet and regular exercise encouraged. Consistent sleep schedule with 6-8 hours recommended.   Please continue follow up with care team as directed.   Follow up with me in 6 months   You may receive a survey regarding today's visit. I encourage you to leave honest feed back as I do use this information to improve patient care. Thank you for seeing me today!   Carbidopa; Levodopa sustained-release tablets What is this medicine? CARBIDOPA; LEVODOPA (kar bi DOE pa; lee voe DOE pa) is used to treat the symptoms of Parkinson's disease. This medicine may be used for other purposes; ask your health care provider or pharmacist if you have questions. COMMON BRAND NAME(S): SINEMET, SINEMET CR What should I tell my health care provider before I take this medicine? They need to know if you have any of these conditions:  depression or other mental illness  diabetes  glaucoma  heart disease, including history of a heart attack  history of irregular heartbeat  kidney disease  liver disease  lung or breathing disease, like asthma  narcolepsy  sleep apnea  stomach or intestine problems  an unusual or allergic reaction to levodopa, carbidopa, other medicines, foods, dyes, or preservatives  pregnant or trying to get pregnant  breast-feeding How should I use this medicine? Take this medicine by mouth with a glass of water. Follow the directions on the prescription label. Swallow whole. Do not crush or chew. You may cut the tablets in half. Take your doses at regular intervals. Do not take your medicine more often than directed. Do not stop taking  except on the advice of your doctor or health care professional. Talk to your pediatrician regarding the use of this medicine in children. Special care may be needed. Overdosage: If you think you have taken too much of this medicine contact a poison control center or emergency room at once. NOTE: This medicine is only for you. Do not share this medicine with others. What if I miss a dose? If you miss a dose, take it as soon as you can. If it is almost time for your next dose, take only that dose. Do not take double or extra doses. What may interact with this medicine? Do not take this medicine with any of the following medications:  MAOIs like Marplan, Nardil, and Parnate  reserpine  tetrabenazine This medicine may also interact with the following medications:  alcohol  droperidol  entacapone  iron supplements or multivitamins with iron  isoniazid, INH  linezolid  medicines for depression, anxiety, or psychotic disturbances  medicines for high blood pressure  medicines for sleep  metoclopramide  papaverine  procarbazine  tedizolid  rasagiline  selegiline  tolcapone This list may not describe all possible interactions. Give your health care provider a list of all the medicines, herbs, non-prescription drugs, or dietary supplements you use. Also tell them if you smoke, drink alcohol, or use illegal drugs. Some items may interact with your medicine. What should I watch for while using this medicine? Visit your health care professional for regular checks on your progress. Tell your health care professional if your symptoms do not start to get better or  if they get worse. Do not stop taking except on your health care professional's advice. You may develop a severe reaction. Your health care professional will tell you how much medicine to take. You may get drowsy or dizzy. Do not drive, use machinery, or do anything that needs mental alertness until you know how this drug  affects you. Do not stand or sit up quickly, especially if you are an older patient. This reduces the risk of dizzy or fainting spells. Alcohol may interfere with the effect of this medicine. Avoid alcoholic drinks. When taking this medicine, you may fall asleep without notice. You may be doing activities like driving a car, talking, or eating. You may not feel drowsy before it happens. Contact your health care provider right away if this happens to you. There have been reports of increased sexual urges or other strong urges such as gambling while taking this medicine. If you experience any of these while taking this medicine, you should report this to your health care provider as soon as possible. You may experience a "wearing off" effect prior to the time for your next dose of this medicine. You may also experience an "on-off" effect where the medicine apparently stops working for anything from a minute to several hours, then suddenly starts working again. Tell your doctor or health care professional if any of these symptoms happen to you. Your dose may need adjustment. A high protein diet can slow or prevent absorption of this medicine. Avoid high protein foods near the time of taking this medicine to help to prevent these problems. Take this medicine at least 30 minutes before eating or one hour after meals. You may want to eat higher protein foods later in the day or in small amounts. Discuss your diet with your doctor or health care professional or nutritionist. If you have diabetes, you may get a false-positive result for sugar in your urine. Check with your doctor or health care professional. This medicine may discolor the urine or sweat, making it look darker or red in color. This is of no cause for concern. However, this may stain clothing or fabrics. This medicine may cause a decrease in vitamin B6. You should make sure that you get enough vitamin B6 while you are taking this medicine. Discuss the  foods you eat and the vitamins you take with your health care professional. What side effects may I notice from receiving this medicine? Side effects that you should report to your doctor or health care professional as soon as possible:  allergic reactions like skin rash, itching or hives, swelling of the face, lips, or tongue  changes in emotions or moods  falling asleep during normal activities like driving  fast, irregular heartbeat  feeling faint or lightheaded, falls  fever  hallucinations  new or increased gambling urges, sexual urges, uncontrolled spending, binge or compulsive eating, or other urges  stomach pain  trouble passing urine or change in the amount of urine  uncontrollable movements of the arms, face, head, mouth, neck, or upper body Side effects that usually do not require medical attention (report to your doctor or health care professional if they continue or are bothersome):  dizziness  headache  loss of appetite  nausea  trouble sleeping This list may not describe all possible side effects. Call your doctor for medical advice about side effects. You may report side effects to FDA at 1-800-FDA-1088. Where should I keep my medicine? Keep out of the reach of children. Store  below 30 degrees C (86 degrees F). Keep container tightly closed. Throw away any unused medicine after the expiration date. NOTE: This sheet is a summary. It may not cover all possible information. If you have questions about this medicine, talk to your doctor, pharmacist, or health care provider.  2021 Elsevier/Gold Standard (2019-03-22 19:49:11)    Parkinson's Disease Parkinson's disease causes problems with movements. It is a long-term condition. It gets worse over time (is progressive). It affects each person in different ways. It makes it harder for you to:  Control how your body moves.  Move your body normally. The condition can range from mild to very bad  (advanced). What are the causes? This condition results from a loss of brain cells called neurons. These brain cells make a chemical called dopamine, which is needed to control body movement. As the condition gets worse, the brain cells make less dopamine. This makes it hard to move or control your movements. The exact cause of this condition is not known. What increases the risk?  Being male.  Being age 63 or older.  Having family members who had Parkinson's disease.  Having had an injury to the brain.  Being very sad (depressed).  Being around things that are harmful or poisonous. What are the signs or symptoms? Symptoms of this condition can vary. The main symptoms have to do with movement. These include:  A tremor or shaking while you are resting that you cannot control.  Stiffness in your neck, arms, and legs.  Slowing of movement. This may include: ? Losing expressions of the face. ? Having trouble making small movements that are needed to button your clothing or brush your teeth.  Walking in a way that is not normal. You may walk with short, shuffling steps.  Loss of balance when standing. You may sway, fall backward, or have trouble making turns. Other symptoms include:  Being very sad, worried, or confused.  Seeing or hearing things that are not real.  Losing thinking abilities (dementia).  Trouble speaking or swallowing.  Having a hard time pooping (constipation).  Needing to pee right away, peeing often, or not being able to control when you pee or poop.  Sleep problems.   How is this treated? There is no cure. The goal of treatment is to manage your symptoms. Treatment may include:  Medicines.  Therapy to help with talking or movement.  Surgery to reduce shaking and other movements that you cannot control. Follow these instructions at home: Medicines  Take over-the-counter and prescription medicines only as told by your doctor.  Avoid taking pain  or sleeping medicines. Eating and drinking  Follow instructions from your doctor about what you cannot eat or drink.  Do not drink alcohol. Activity  Talk with your doctor about if it is safe for you to drive.  Do exercises as told by your doctor. Lifestyle  Put in grab bars and railings in your home. These help to prevent falls.  Do not use any products that contain nicotine or tobacco, such as cigarettes, e-cigarettes, and chewing tobacco. If you need help quitting, ask your doctor.  Join a support group.      General instructions  Talk with your doctor about what you need help with and what your safety needs are.  Keep all follow-up visits as told by your doctor, including any therapy visits to help with talking or moving. This is important. Contact a doctor if:  Medicines do not help your symptoms.  You feel off-balance.  You fall at home.  You need more help at home.  You have trouble swallowing.  You have a very hard time pooping.  You have a lot of side effects from your medicines.  You feel very sad, worried, or confused. Get help right away if:  You were hurt in a fall.  You see or hear things that are not real.  You cannot swallow without choking.  You have chest pain or trouble breathing.  You do not feel safe at home.  You have thoughts about hurting yourself or others. If you ever feel like you may hurt yourself or others, or have thoughts about taking your own life, get help right away. You can go to your nearest emergency department or call:  Your local emergency services (911 in the [place]  A suicide crisis helpline, such as the [facility] at (340)526-4618. This is open 24 hours a day. Summary  This condition causes problems with movements.  It is a long-term condition. It gets worse over time.  There is no cure. Treatment focuses on managing your symptoms.  Talk with your doctor about what you need help  with and what your safety needs are.  Keep all follow-up visits as told by your doctor. This is important. This information is not intended to replace advice given to you by your health care provider. Make sure you discuss any questions you have with your health care provider. Document Revised: 10/01/2018 Document Reviewed: 10/01/2018 Elsevier Patient Education  [place].

## 2020-09-26 NOTE — Progress Notes (Addendum)
PATIENT: Gerald Knapp DOB: 02/14/1931  REASON FOR VISIT: follow up HISTORY FROM: patient  Chief Complaint  Patient presents with  . Follow-up    RM 2 wife (lilly) PT is stable, slight shaking increase.     HISTORY OF PRESENT ILLNESS: 09/26/20 ALL:  He returns for follow up on Parkinson's Disease. He is doing well. He continues carbidopa levodopa 25/100mg  QID. He feels that tremor is failry well managed during the day. Right hand tremor seems to be worse just before bedtime. He has a hard time getting to sleep due to tremor. He is sleeping well. He continues to be very careful with eating and drinking. He feels that as long as he eats slowly and is careful with liquids, he does not get choked. He continues to drink a couple of sips of amaretto that seems to help. He was recently seen by PCP following Covid diagnosis in January. He has recovered nicely. He reports PCP noticed an irregular heart rate at follow up. EKG was unremarkable per patient. He does have occasional weakness. He has had some intermittent dizziness over the past month. Uncertain if related to Covid.  No falls. Gait is stable. He continues to use cane.    03/23/2020 ALL:  Gerald Knapp is a 87 y.o. male here today for follow up for Parkinson's Disease. He continues Sinemet 1 tablet TID. Tremor has worsened slightly over the past 4 months. He feels tremor and tension more in right forearm. He is tolerating Sinemet well with no adverse effects noted. He does continue to notice more saliva production and feels he gets strangled easily. He denies any difficulty eating or swallowing foods/liquids. He feels that he has more drainage from seasonal allergies and that he gets a little strangled on drainage and saliva. He will sip on a glass of amaretto throughout the day and this prevents him from getting strangled. He feels gait is stable. 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 He did get tripped up in his tomato  patch and fell over. Fortunately, no injuries. He uses a walking stick or cane everywhere he goes. He feels that he is eating well but has lost about 11 pounds since last being seen. He feels this is related to his wife no longer cooking full meals like she used to. His wife reports that he has had a decreased appetite recently. He is drinking a high protein Boost drink every day.    HISTORY: (copied from Dr Guadelupe Sabin note on 11/17/2019)  Mr. Gerald Knapp is an 87 year old right-handed gentleman with an underlying medical history of reflux disease, lumbar spinal stenosis with chronic low back pain, allergic rhinitis, and BPH, who presents for follow-up consultation of his right-sided parkinsonism.  The patient is accompanied by his wife again today.  I first met him on 08/19/2019 at the request of his primary care physician, at which time he reported a 2 to 55-month history of tremor affecting his right more than left hand.  His exam was in keeping with parkinsonism with right-sided lateralization noted.  He was advised to start a trial of low-dose Sinemet with gradual titration.  His wife called in the interim in February 2021 reporting that he had chest pains and dizziness.  They were not sure if these symptoms came from taking Terazosin.  He was advised to follow-up with primary care physician or proceed to the emergency room should he have chest pain.    Today, 11/17/2019: He reports feeling  better with regards to his tremor, he is able to tolerate Sinemet, 1 pill 3 times daily, first pill around 7, second pill between 2 and 3 and last pill around 10 PM.  He feels that his tremor is just a little bit better and his wife endorses that when he feeds himself he does not shake quite as much.  When he stopped the Terazosin his dizziness improved and he was able to take the Sinemet all along.  He has intermittent constipation, takes a stool softener on a daily basis.  He does not drink a whole lot of water, likes to  drink tea or soda. No falls.   The patient's allergies, current medications, family history, past medical history, past social history, past surgical history and problem list were reviewed and updated as appropriate.   Previously:   08/19/19: (He) reports a recent onset right more than left hand tremor, started in or around November 2020. He reports it is primarily on the right side and happens primarily when he is sitting or when walking. It does not seem to as noticeable when he holds something. He does have some fine motor dyscontrol. He has noticed difficulty swallowing at times, feels like he chokes on his own saliva even at times. The choking-like sensation happens with liquids and solids. He sleeps fairly well, he sleeps in a lift chair for the past year because of his low back pain. He has seen Dr. Tonita Cong for this, he has also had steroid injections under Dr. Nelva Bush, about 3 or 4 altogether with limited success. 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 He has no family history of tremor or Parkinson's disease. Upon further asking, his wife has noticed change in his walking, he tends to shuffle at times and she has also noticed a more stooped posture. He walks with a walking stick when he is outside on the farm. He still uses his tractor and feeds the cattle. Thankfully, he has not fallen.    REVIEW OF SYSTEMS: Out of a complete 14 system review of symptoms, the patient complains only of the following symptoms, tremor, decreased appetite, dizziness and all other reviewed systems are negative.   ALLERGIES: No Known Allergies  HOME MEDICATIONS: Outpatient Medications Prior to Visit  Medication Sig Dispense Refill  . aspirin 81 MG chewable tablet Chew 81 mg by mouth daily.    . carbidopa-levodopa (SINEMET IR) 25-100 MG tablet Take 1 tablet by mouth 4 (four) times daily. 360 tablet 3  . fluticasone  (VERAMYST) 27.5 MCG/SPRAY nasal spray Place 2 sprays into the nose daily.    Marland Kitchen loratadine (CLARITIN) 10 MG tablet Take 10 mg by mouth daily.    . Probiotic Product (PROBIOTIC-10 PO) Take by mouth.    . traMADol (ULTRAM) 50 MG tablet      No facility-administered medications prior to visit.    PAST MEDICAL HISTORY: Past Medical History:  Diagnosis Date  . Allergic rhinitis   . Benign prostate hyperplasia   . Chronic kidney disease   . GERD (gastroesophageal reflux disease)   . Hypertension   . Tremor     PAST SURGICAL HISTORY: Past Surgical History:  Procedure Laterality Date  . CARDIAC CATHETERIZATION    . CATARACT EXTRACTION    . COLONOSCOPY    . HERNIA REPAIR    . NOSE SURGERY      FAMILY HISTORY: Family History  Problem  Relation Age of Onset  . Arthritis Mother   . Hypertension Mother   . Cancer Father   . Diabetes Father   . Arthritis Father     SOCIAL HISTORY: Social History   Socioeconomic History  . Marital status: Married    Spouse name: Lilly   . Number of children: Not on file  . Years of education: Not on file  . Highest education level: Not on file  Occupational History  . Not on file  Tobacco Use  . Smoking status: Never Smoker  . Smokeless tobacco: Never Used  Substance and Sexual Activity  . Alcohol use: Yes    Comment: Occastional Drink   . Drug use: Not on file  . Sexual activity: Not on file  Other Topics Concern  . Not on file  Social History Narrative  . Not on file   Social Determinants of Health   Financial Resource Strain: Not on file  Food Insecurity: Not on file  Transportation Needs: Not on file  Physical Activity: Not on file  Stress: Not on file  Social Connections: Not on file  Intimate Partner Violence: Not on file      PHYSICAL EXAM  Vitals:   09/26/20 1044  BP: 136/68  Pulse: (!) 59  Weight: 184 lb (83.5 kg)  Height: 5\' 8"  (1.727 m)   Body mass index is 27.98 kg/m.  Generalized: Well developed, in  no acute distress  Cardiology: irregular rhythm, rate normal, no murmur noted Respiratory: clear to auscultation bilaterally  Neurological examination  Mentation: Alert oriented to time, place, history taking. Follows all commands speech and language fluent Cranial nerve II-XII: Pupils were equal round reactive to light. Extraocular movements were full, visual field were full on confrontational test. Facial sensation and strength were normal. Uvula tongue midline. Head turning and shoulder shrug  were normal and symmetric. Motor: The motor testing reveals 5 over 5 strength of all 4 extremities. Good symmetric motor tone is noted throughout. Resting tremor notes of right hand  Sensory: Sensory testing is intact to soft touch on all 4 extremities. No evidence of extinction is noted.  Coordination: Cerebellar testing reveals good finger-nose-finger and heel-to-shin bilaterally.  Gait and station: Gait is short, stable with cane, stooped posture Reflexes: Deep tendon reflexes are symmetric and normal bilaterally.   DIAGNOSTIC DATA (LABS, IMAGING, TESTING) - I reviewed patient records, labs, notes, testing and imaging myself where available.  No flowsheet data found.   No results found for: WBC, HGB, HCT, MCV, PLT No results found for: NA, K, CL, CO2, GLUCOSE, BUN, CREATININE, CALCIUM, PROT, ALBUMIN, AST, ALT, ALKPHOS, BILITOT, GFRNONAA, GFRAA No results found for: CHOL, HDL, LDLCALC, LDLDIRECT, TRIG, CHOLHDL No results found for: HGBA1C No results found for: VITAMINB12 No results found for: TSH     ASSESSMENT AND PLAN 87 y.o. year old male  has a past medical history of Allergic rhinitis, Benign prostate hyperplasia, Chronic kidney disease, GERD (gastroesophageal reflux disease), Hypertension, and Tremor. here with     ICD-10-CM   1. Parkinson's disease ([place])  G20   2. Dysphagia, unspecified type  R13.10   3. Irregular heartbeat  I49.9      Gerald Knapp is doing well, overall. He continues to  tolerate Sinemet. We will continue 25-100mg  QID. I will add carbidpa-levodopa CR 50/200mg  at dinner for better control of tremor at night. Side effects reviewed. He was educated on appropriate timing of medication dosing. We have revisited concerns of difficulty swallowing. He feels symptoms are  stable and does not wish to pursue follow up. He will let me know if symptoms worsen. Irregular heart rhythm noted on exam today. EKG not available. I have asked him and his wife to let PCP know. I will send office notes from today to PCP. He was encouraged to discuss with PCP. He is asymptomatic and vitals stable. He will continue regular activity. Fall precautions reviewed. He will focus on healthy lifestyle habits. He will follow up with me in 6 months, sooner if needed.    No orders of the defined types were placed in this encounter.    Meds ordered this encounter  Medications  . carbidopa-levodopa (SINEMET CR) 50-200 MG tablet    Sig: Take 1 tablet by mouth daily.    Dispense:  30 tablet    Refill:  0    Order Specific Question:   Supervising Provider    Answer:   Melvenia Beam [id number]      I spent 30 minutes with the patient. 50% of this time was spent counseling and educating patient on plan of care and medications.    Debbora Presto, FNP-C 09/26/2020, 12:55 PM [facility] [address], [place], [place] [address] [phone]  I reviewed the above note and documentation by the Nurse Practitioner and agree with the history, exam, assessment and plan as outlined above. I was available for consultation. Star Age, MD, PhD [facility])

## 2020-09-26 NOTE — Progress Notes (Signed)
Fax confirmation received for Dr. Consuello Masse [phone]. [phone].

## 2020-10-10 ENCOUNTER — Telehealth: Payer: Self-pay | Admitting: Family Medicine

## 2020-10-10 MED ORDER — CARBIDOPA-LEVODOPA ER 50-200 MG PO TBCR: 1.0000 | EXTENDED_RELEASE_TABLET | Freq: Every day | ORAL | 0 refills | Status: DC

## 2020-10-10 NOTE — Addendum Note (Signed)
Addended by: Lester [place] A on: 10/10/2020 10:35 AM   Modules accepted: Orders

## 2020-10-10 NOTE — Telephone Encounter (Signed)
I called patient's wife, per DPR, to discuss.  No answer left a voicemail asking her to call me back.  Refill of Sinemet CR sent to [facility] mail order pharmacy.

## 2020-10-10 NOTE — Telephone Encounter (Signed)
Pt.'s wife Tim Lair is on [place]. She is requesting a refill for carbidopa-levodopa (SINEMET CR) 50-200 MG tablet as pt. is tolerating it well. She states he states that it is helping.  Pharmacy: [facility]

## 2020-10-11 NOTE — Telephone Encounter (Signed)
Pt's wife called back and advised refill had been sent as requested

## 2020-10-11 NOTE — Telephone Encounter (Signed)
Pt's wife returned call. Please call back when available.  °

## 2020-11-13 DIAGNOSIS — K2100 Gastro-esophageal reflux disease with esophagitis, without bleeding: Secondary | ICD-10-CM | POA: Diagnosis not present

## 2020-11-13 DIAGNOSIS — I10 Essential (primary) hypertension: Secondary | ICD-10-CM | POA: Diagnosis not present

## 2020-11-13 DIAGNOSIS — Z13220 Encounter for screening for lipoid disorders: Secondary | ICD-10-CM | POA: Diagnosis not present

## 2020-11-13 DIAGNOSIS — N183 Chronic kidney disease, stage 3 unspecified: Secondary | ICD-10-CM | POA: Diagnosis not present

## 2020-11-16 DIAGNOSIS — Z23 Encounter for immunization: Secondary | ICD-10-CM | POA: Diagnosis not present

## 2020-11-16 DIAGNOSIS — F5221 Male erectile disorder: Secondary | ICD-10-CM | POA: Diagnosis not present

## 2020-11-16 DIAGNOSIS — R634 Abnormal weight loss: Secondary | ICD-10-CM | POA: Diagnosis not present

## 2020-11-16 DIAGNOSIS — N401 Enlarged prostate with lower urinary tract symptoms: Secondary | ICD-10-CM | POA: Diagnosis not present

## 2020-11-16 DIAGNOSIS — M40295 Other kyphosis, thoracolumbar region: Secondary | ICD-10-CM | POA: Diagnosis not present

## 2020-11-16 DIAGNOSIS — G20 Parkinson's disease: Secondary | ICD-10-CM | POA: Diagnosis not present

## 2020-11-16 DIAGNOSIS — I10 Essential (primary) hypertension: Secondary | ICD-10-CM | POA: Diagnosis not present

## 2020-11-20 DIAGNOSIS — S30860A Insect bite (nonvenomous) of lower back and pelvis, initial encounter: Secondary | ICD-10-CM | POA: Diagnosis not present

## 2020-11-28 ENCOUNTER — Telehealth: Payer: Self-pay | Admitting: Family Medicine

## 2020-11-28 MED ORDER — CARBIDOPA-LEVODOPA ER 50-200 MG PO TBCR: 1.0000 | EXTENDED_RELEASE_TABLET | Freq: Every day | ORAL | 1 refills | Status: DC

## 2020-11-28 NOTE — Telephone Encounter (Signed)
Pt request refill carbidopa-levodopa (SINEMET CR) 50-200 MG tablet at [facility]

## 2020-12-06 DIAGNOSIS — M170 Bilateral primary osteoarthritis of knee: Secondary | ICD-10-CM | POA: Diagnosis not present

## 2020-12-06 DIAGNOSIS — M1711 Unilateral primary osteoarthritis, right knee: Secondary | ICD-10-CM | POA: Diagnosis not present

## 2021-01-11 DIAGNOSIS — M1711 Unilateral primary osteoarthritis, right knee: Secondary | ICD-10-CM | POA: Diagnosis not present

## 2021-01-18 DIAGNOSIS — M1711 Unilateral primary osteoarthritis, right knee: Secondary | ICD-10-CM | POA: Diagnosis not present

## 2021-01-24 DIAGNOSIS — M1711 Unilateral primary osteoarthritis, right knee: Secondary | ICD-10-CM | POA: Diagnosis not present

## 2021-02-27 DIAGNOSIS — L570 Actinic keratosis: Secondary | ICD-10-CM | POA: Diagnosis not present

## 2021-03-05 ENCOUNTER — Other Ambulatory Visit: Payer: Self-pay

## 2021-03-05 DIAGNOSIS — G20 Parkinson's disease: Secondary | ICD-10-CM

## 2021-03-05 MED ORDER — CARBIDOPA-LEVODOPA 25-100 MG PO TABS: 1.0000 | ORAL_TABLET | Freq: Four times a day (QID) | ORAL | 0 refills | Status: DC

## 2021-03-19 DIAGNOSIS — M1711 Unilateral primary osteoarthritis, right knee: Secondary | ICD-10-CM | POA: Diagnosis not present

## 2021-04-03 NOTE — Patient Instructions (Signed)
Below is our plan:  We will continue Sinemet IR four times daily. Try taking medication about 20-30 minutes before meals. Continue Sinemet CR at bedtime. Continue close follow up with orthopedics and PCP.   Please make sure you are staying well hydrated. I recommend 50-60 ounces daily. Well balanced diet and regular exercise encouraged. Consistent sleep schedule with 6-8 hours recommended.   Please continue follow up with care team as directed.   Follow up with me in 6 months   You may receive a survey regarding today's visit. I encourage you to leave honest feed back as I do use this information to improve patient care. Thank you for seeing me today!

## 2021-04-03 NOTE — Progress Notes (Addendum)
PATIENT: Gerald Knapp DOB: 1931/01/18  REASON FOR VISIT: follow up HISTORY FROM: patient  Chief Complaint  Patient presents with   Follow-up    Rm 1, w wife. Here for 6 month parkinson's f/u, pt reports doing well. No change in sx since last OV.     HISTORY OF PRESENT ILLNESS:  04/04/2021 ALL:  Gerald Knapp returns for follow up for Parkinson's Disease. He continues generic Sinemet 25-170m QID (8am, 12pm, 6:30pm, 10pm). We added Sinemet CR 50-2058mat bedtime at last visit due to concerns of worsening tremor at night. He does feel that it helped. He is not bothered by tremor at night. He does note waxing and waning tremor through the day but is not overly bothered by this.   Gait is stable. He did have one fall in his garden a few weeks ago while picking tomatoes. He reports leaning forward and losing his balance. No injuries. He feels that he is doing fairly well staying active. 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 He gets tired but does not feel overly fatigued or short of breath.   Swallowing seems better, rare episodes of coughing. His appetite has decreased. He thinks this is due to having more knee pain. He is working with orthopedics who is recommending knee replacement.   He was seen by PCP in 10/2020, after our last visit. He reports that PCP also heard an irregular heart rhythm. EKG was reportedly normal. BP is usually 140's/70's. He feels it is elevated, today, due to traveling 29/70 in traffic. No palpitations, excessive fatigue, dizziness, shortness of breath or stroke like symptoms.   09/26/2020 ALL:  He returns for follow up on Parkinson's Disease. He is doing well. He continues carbidopa levodopa 25/10035mID. He feels that tremor is failry well managed during the day. Right hand tremor seems to be worse just before bedtime. He has a hard time getting to sleep due to tremor. He is sleeping well. He continues to be very careful with eating and drinking. He feels that as long as he  eats slowly and is careful with liquids, he does not get choked. He continues to drink a couple of sips of amaretto that seems to help. He was recently seen by PCP following Covid diagnosis in January. He has recovered nicely. He reports PCP noticed an irregular heart rate at follow up. EKG was unremarkable per patient. He does have occasional weakness. He has had some intermittent dizziness over the past month. Uncertain if related to Covid.  No falls. Gait is stable. He continues to use cane.   03/23/2020 ALL:  CarJavion Knapp a 87 12o. male here today for follow up for Parkinson's Disease. He continues Sinemet 1 tablet TID. Tremor has worsened slightly over the past 4 months. He feels tremor and tension more in right forearm. He is tolerating Sinemet well with no adverse effects noted. He does continue to notice more saliva production and feels he gets strangled easily. He denies any difficulty eating or swallowing foods/liquids. He feels that he has more drainage from seasonal allergies and that he gets a little strangled on drainage and saliva. He will sip on a glass of amaretto throughout the day and this prevents him from getting strangled. He feels gait is stable. 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 He did get tripped up in his tomato patch and fell over. Fortunately, no injuries. He uses a walking stick or cane everywhere he goes.  He feels that he is eating well but has lost about 11 pounds since last being seen. He feels this is related to his wife no longer cooking full meals like she used to. His wife reports that he has had a decreased appetite recently. He is drinking a high protein Boost drink every day.    HISTORY: (copied from Dr Guadelupe Sabin note on 11/17/2019)  Gerald Knapp is an 87 year old right-handed gentleman with an underlying medical history of reflux disease, lumbar spinal stenosis with chronic low back pain, allergic rhinitis, and BPH, who presents for follow-up  consultation of his right-sided parkinsonism.  The patient is accompanied by his wife again today.  I first met him on 08/19/2019 at the request of his primary care physician, at which time he reported a 2 to 24-monthhistory of tremor affecting his right more than left hand.  His exam was in keeping with parkinsonism with right-sided lateralization noted.  He was advised to start a trial of low-dose Sinemet with gradual titration.   His wife called in the interim in February 2021 reporting that he had chest pains and dizziness.  They were not sure if these symptoms came from taking Terazosin.  He was advised to follow-up with primary care physician or proceed to the emergency room should he have chest pain.     Today, 11/17/2019: He reports feeling better with regards to his tremor, he is able to tolerate Sinemet, 1 pill 3 times daily, first pill around 7, second pill between 2 and 3 and last pill around 10 PM.  He feels that his tremor is just a little bit better and his wife endorses that when he feeds himself he does not shake quite as much.  When he stopped the Terazosin his dizziness improved and he was able to take the Sinemet all along.  He has intermittent constipation, takes a stool softener on a daily basis.  He does not drink a whole lot of water, likes to drink tea or soda. No falls.    The patient's allergies, current medications, family history, past medical history, past social history, past surgical history and problem list were reviewed and updated as appropriate.    Previously:    08/19/19: (He) reports a recent onset right more than left hand tremor, started in or around November 2020.  He reports it is primarily on the right side and happens primarily when he is sitting or when walking.  It does not seem to as noticeable when he holds something.  He does have some fine motor dyscontrol.  He has noticed difficulty swallowing at times, feels like he chokes on his own saliva even at times.   The choking-like sensation happens with liquids and solids.  He sleeps fairly well, he sleeps in a lift chair for the past year because of his low back pain.  He has seen Dr. BTonita Congfor this, he has also had steroid injections under Dr. RNelva Bush about 3 or 4 altogether with limited success.  He is still active on his farm, they raise cows.  His daughter and son-in-law help on the farm, they stay on the farm.  He has no family history of tremor or Parkinson's disease.  Upon further asking, his wife has noticed change in his walking, he tends to shuffle at times and she has also noticed a more stooped posture.  He walks with a walking stick when he is outside on the farm.  He still uses his tractor and feeds the  cattle.  Thankfully, he has not fallen.     REVIEW OF SYSTEMS: Out of a complete 14 system review of symptoms, the patient complains only of the following symptoms, tremor, decreased appetite, left knee pain and all other reviewed systems are negative.   ALLERGIES: No Known Allergies  HOME MEDICATIONS: Outpatient Medications Prior to Visit  Medication Sig Dispense Refill   aspirin 81 MG chewable tablet Chew 81 mg by mouth daily.     carbidopa-levodopa (SINEMET CR) 50-200 MG tablet Take 1 tablet by mouth daily. 90 tablet 1   carbidopa-levodopa (SINEMET IR) 25-100 MG tablet Take 1 tablet by mouth 4 (four) times daily. 360 tablet 0   fluticasone (VERAMYST) 27.5 MCG/SPRAY nasal spray Place 2 sprays into the nose daily.     loratadine (CLARITIN) 10 MG tablet Take 10 mg by mouth daily.     Probiotic Product (PROBIOTIC-10 PO) Take by mouth.     traMADol (ULTRAM) 50 MG tablet      No facility-administered medications prior to visit.    PAST MEDICAL HISTORY: Past Medical History:  Diagnosis Date   Allergic rhinitis    Benign prostate hyperplasia    Chronic kidney disease    GERD (gastroesophageal reflux disease)    Hypertension    Tremor     PAST SURGICAL HISTORY: Past Surgical History:   Procedure Laterality Date   CARDIAC CATHETERIZATION     CATARACT EXTRACTION     COLONOSCOPY     HERNIA REPAIR     NOSE SURGERY      FAMILY HISTORY: Family History  Problem Relation Age of Onset   Arthritis Mother    Hypertension Mother    Cancer Father    Diabetes Father    Arthritis Father     SOCIAL HISTORY: Social History   Socioeconomic History   Marital status: Married    Spouse name: Production manager    Number of children: Not on file   Years of education: Not on file   Highest education level: Not on file  Occupational History   Not on file  Tobacco Use   Smoking status: Never   Smokeless tobacco: Never  Substance and Sexual Activity   Alcohol use: Yes    Comment: Occastional Drink    Drug use: Not on file   Sexual activity: Not on file  Other Topics Concern   Not on file  Social History Narrative   Not on file   Social Determinants of Health   Financial Resource Strain: Not on file  Food Insecurity: Not on file  Transportation Needs: Not on file  Physical Activity: Not on file  Stress: Not on file  Social Connections: Not on file  Intimate Partner Violence: Not on file      PHYSICAL EXAM  Vitals:   04/04/21 1048 04/04/21 1125  BP: (!) 175/78 (!) 144/80  Pulse: (!) 58   Weight: 179 lb (81.2 kg)   Height: 5' 8" (1.727 m)     Body mass index is 27.22 kg/m.  Generalized: Well developed, in no acute distress  Cardiology: irregular rhythm, rate normal, no murmur noted Respiratory: clear to auscultation bilaterally  Neurological examination  Mentation: Alert oriented to time, place, history taking. Follows all commands speech and language fluent Cranial nerve II-XII: Pupils were equal round reactive to light. Extraocular movements were full, visual field were full on confrontational test. Facial sensation and strength were normal. Uvula tongue midline. Head turning and shoulder shrug  were normal and symmetric. Motor: The  motor testing reveals 5 over  5 strength of all 4 extremities. Good symmetric motor tone is noted throughout. Resting tremor notes of right hand  Sensory: Sensory testing is intact to soft touch on all 4 extremities. No evidence of extinction is noted.  Coordination: Cerebellar testing reveals good finger-nose-finger and heel-to-shin bilaterally.  Gait and station: Gait is short, stable with cane, stooped posture Reflexes: Deep tendon reflexes are symmetric and normal bilaterally.   DIAGNOSTIC DATA (LABS, IMAGING, TESTING) - I reviewed patient records, labs, notes, testing and imaging myself where available.  No flowsheet data found.   No results found for: WBC, HGB, HCT, MCV, PLT No results found for: NA, K, CL, CO2, GLUCOSE, BUN, CREATININE, CALCIUM, PROT, ALBUMIN, AST, ALT, ALKPHOS, BILITOT, GFRNONAA, GFRAA No results found for: CHOL, HDL, LDLCALC, LDLDIRECT, TRIG, CHOLHDL No results found for: HGBA1C No results found for: VITAMINB12 No results found for: TSH     ASSESSMENT AND PLAN 87 y.o. year old male  has a past medical history of Allergic rhinitis, Benign prostate hyperplasia, Chronic kidney disease, GERD (gastroesophageal reflux disease), Hypertension, and Tremor. here with     ICD-10-CM   1. Parkinson's disease ([place])  G20        Gerald Knapp is doing well, overall. He continues to tolerate Sinemet. We will continue 25-122m QID and 50-2069mCR dosing at bedtime. He was educated on appropriate timing of medication dosing. We have revisited concerns of difficulty swallowing. He feels symptoms are stable and does not wish to pursue follow up. He will let me know if symptoms worsen. Irregular heart rhythm noted again on exam today. EKG not available. I have asked him and his wife to let PCP know. I will send office notes from today to PCP. He was encouraged to discuss with PCP. He is asymptomatic and vitals stable. He reports last EKG in 10/2020 was normal. He will continue regular activity. Fall precautions reviewed.  He will focus on healthy lifestyle habits. He will follow up with me in 6 months, sooner if needed.    No orders of the defined types were placed in this encounter.    No orders of the defined types were placed in this encounter.     AmDebbora PrestoFNP-C 04/04/2021, 12:02 PM [facility] [id number] [phone] reviewed the above note and documentation by the Nurse Practitioner and agree with the history, exam, assessment and plan as outlined above. I was available for consultation. SaStar AgeMD, PhD [facility] ([facility]

## 2021-04-04 ENCOUNTER — Encounter: Payer: Self-pay | Admitting: Family Medicine

## 2021-04-04 ENCOUNTER — Ambulatory Visit (INDEPENDENT_AMBULATORY_CARE_PROVIDER_SITE_OTHER): Payer: Medicare Other | Admitting: Family Medicine

## 2021-04-04 VITALS — BP 144/80 | HR 58 | Ht 68.0 in | Wt 179.0 lb

## 2021-04-04 DIAGNOSIS — G20 Parkinson's disease: Secondary | ICD-10-CM | POA: Diagnosis not present

## 2021-05-09 ENCOUNTER — Other Ambulatory Visit: Payer: Self-pay

## 2021-05-09 DIAGNOSIS — G20 Parkinson's disease: Secondary | ICD-10-CM

## 2021-05-09 MED ORDER — CARBIDOPA-LEVODOPA ER 50-200 MG PO TBCR: 1.0000 | EXTENDED_RELEASE_TABLET | Freq: Every day | ORAL | 1 refills | Status: DC

## 2021-05-09 MED ORDER — CARBIDOPA-LEVODOPA 25-100 MG PO TABS: 1.0000 | ORAL_TABLET | Freq: Four times a day (QID) | ORAL | 1 refills | Status: DC

## 2021-05-11 DIAGNOSIS — Z13220 Encounter for screening for lipoid disorders: Secondary | ICD-10-CM | POA: Diagnosis not present

## 2021-05-11 DIAGNOSIS — N183 Chronic kidney disease, stage 3 unspecified: Secondary | ICD-10-CM | POA: Diagnosis not present

## 2021-05-11 DIAGNOSIS — R739 Hyperglycemia, unspecified: Secondary | ICD-10-CM | POA: Diagnosis not present

## 2021-05-11 DIAGNOSIS — K2100 Gastro-esophageal reflux disease with esophagitis, without bleeding: Secondary | ICD-10-CM | POA: Diagnosis not present

## 2021-05-11 DIAGNOSIS — I10 Essential (primary) hypertension: Secondary | ICD-10-CM | POA: Diagnosis not present

## 2021-05-16 DIAGNOSIS — A230 Brucellosis due to Brucella melitensis: Secondary | ICD-10-CM | POA: Diagnosis not present

## 2021-05-16 DIAGNOSIS — K219 Gastro-esophageal reflux disease without esophagitis: Secondary | ICD-10-CM | POA: Diagnosis not present

## 2021-05-16 DIAGNOSIS — M5136 Other intervertebral disc degeneration, lumbar region: Secondary | ICD-10-CM | POA: Diagnosis not present

## 2021-05-16 DIAGNOSIS — G20 Parkinson's disease: Secondary | ICD-10-CM | POA: Diagnosis not present

## 2021-05-16 DIAGNOSIS — M519 Unspecified thoracic, thoracolumbar and lumbosacral intervertebral disc disorder: Secondary | ICD-10-CM | POA: Diagnosis not present

## 2021-05-16 DIAGNOSIS — M25561 Pain in right knee: Secondary | ICD-10-CM | POA: Diagnosis not present

## 2021-05-16 DIAGNOSIS — M4316 Spondylolisthesis, lumbar region: Secondary | ICD-10-CM | POA: Diagnosis not present

## 2021-05-16 DIAGNOSIS — I10 Essential (primary) hypertension: Secondary | ICD-10-CM | POA: Diagnosis not present

## 2021-05-16 DIAGNOSIS — I959 Hypotension, unspecified: Secondary | ICD-10-CM | POA: Diagnosis not present

## 2021-05-16 DIAGNOSIS — N401 Enlarged prostate with lower urinary tract symptoms: Secondary | ICD-10-CM | POA: Diagnosis not present

## 2021-05-16 DIAGNOSIS — M40295 Other kyphosis, thoracolumbar region: Secondary | ICD-10-CM | POA: Diagnosis not present

## 2021-05-16 DIAGNOSIS — Z23 Encounter for immunization: Secondary | ICD-10-CM | POA: Diagnosis not present

## 2021-05-31 ENCOUNTER — Encounter: Payer: Self-pay | Admitting: Physical Medicine & Rehabilitation

## 2021-06-13 ENCOUNTER — Encounter: Payer: Self-pay | Admitting: Cardiology

## 2021-06-13 ENCOUNTER — Encounter: Payer: Self-pay | Admitting: *Deleted

## 2021-06-13 ENCOUNTER — Ambulatory Visit (INDEPENDENT_AMBULATORY_CARE_PROVIDER_SITE_OTHER): Payer: Medicare Other | Admitting: Cardiology

## 2021-06-13 ENCOUNTER — Other Ambulatory Visit: Payer: Self-pay | Admitting: Cardiology

## 2021-06-13 ENCOUNTER — Ambulatory Visit (INDEPENDENT_AMBULATORY_CARE_PROVIDER_SITE_OTHER): Payer: Medicare Other

## 2021-06-13 VITALS — BP 160/80 | HR 64 | Ht 68.0 in | Wt 179.8 lb

## 2021-06-13 DIAGNOSIS — R000 Tachycardia, unspecified: Secondary | ICD-10-CM

## 2021-06-13 DIAGNOSIS — R0789 Other chest pain: Secondary | ICD-10-CM | POA: Diagnosis not present

## 2021-06-13 NOTE — Progress Notes (Signed)
Clinical Summary Mr. Lumadue is a 87 y.o.male seen today as a new consult ,referred by Dr Quintin Alto for the following medical problems.    1. Chest pain/Tachycardia - episode in September - while at home, tightness across top of chest. No other associated symptoms - checked bp 70s/60s, HRs 140s. Sat still for a few minutes and symptoms resolved, lasted 10-15 minutes.  - rechecked viatls when symptoms resolved, HR and bp's were normal  Another episode in October, milder episode. Lasts 1-2 minutes.   - limited exrtion due to spinal stenosis, parkinsons.  -no regular exertional symptoms - EKG from pcp SR, isoalted PVC     2. Parkinson's disease  Past Medical History:  Diagnosis Date   Allergic rhinitis    Benign prostate hyperplasia    Chronic kidney disease    GERD (gastroesophageal reflux disease)    Hypertension    Tremor      No Known Allergies   Current Outpatient Medications  Medication Sig Dispense Refill   aspirin 81 MG chewable tablet Chew 81 mg by mouth daily.     carbidopa-levodopa (SINEMET CR) 50-200 MG tablet Take 1 tablet by mouth daily. 90 tablet 1   carbidopa-levodopa (SINEMET IR) 25-100 MG tablet Take 1 tablet by mouth 4 (four) times daily. 360 tablet 1   fluticasone (VERAMYST) 27.5 MCG/SPRAY nasal spray Place 2 sprays into the nose daily.     loratadine (CLARITIN) 10 MG tablet Take 10 mg by mouth daily.     Probiotic Product (PROBIOTIC-10 PO) Take by mouth.     traMADol (ULTRAM) 50 MG tablet      No current facility-administered medications for this visit.     Past Surgical History:  Procedure Laterality Date   CARDIAC CATHETERIZATION     CATARACT EXTRACTION     COLONOSCOPY     HERNIA REPAIR     NOSE SURGERY       No Known Allergies    Family History  Problem Relation Age of Onset   Arthritis Mother    Hypertension Mother    Cancer Father    Diabetes Father    Arthritis Father      Social History Mr. Portilla reports that he  has never smoked. He has never used smokeless tobacco. Mr. Novacek reports current alcohol use.   Review of Systems CONSTITUTIONAL: No weight loss, fever, chills, weakness or fatigue.  HEENT: Eyes: No visual loss, blurred vision, double vision or yellow sclerae.No hearing loss, sneezing, congestion, runny nose or sore throat.  SKIN: No rash or itching.  CARDIOVASCULAR: per hpi RESPIRATORY: No shortness of breath, cough or sputum.  GASTROINTESTINAL: No anorexia, nausea, vomiting or diarrhea. No abdominal pain or blood.  GENITOURINARY: No burning on urination, no polyuria NEUROLOGICAL: No headache, dizziness, syncope, paralysis, ataxia, numbness or tingling in the extremities. No change in bowel or bladder control.  MUSCULOSKELETAL: No muscle, back pain, joint pain or stiffness.  LYMPHATICS: No enlarged nodes. No history of splenectomy.  PSYCHIATRIC: No history of depression or anxiety.  ENDOCRINOLOGIC: No reports of sweating, cold or heat intolerance. No polyuria or polydipsia.  Marland Kitchen   Physical Examination Today's Vitals   06/13/21 1137  BP: (!) 160/80  Pulse: 64  SpO2: 97%  Weight: 179 lb 12.8 oz (81.6 kg)  Height: 5\' 8"  (1.727 m)   Body mass index is 27.34 kg/m.  Gen: resting comfortably, no acute distress HEENT: no scleral icterus, pupils equal round and reactive, no palptable cervical adenopathy,  CV:  RRR, no m/r/g no jvd Resp: Clear to auscultation bilaterally GI: abdomen is soft, non-tender, non-distended, normal bowel sounds, no hepatosplenomegaly MSK: extremities are warm, no edema.  Skin: warm, no rash Neuro:  no focal deficits Psych: appropriate affect      Assessment and Plan  Chest pain/tachycardia -episode as reported above, chest tightness with HRs to 140s on his home monitor with low bp's - symptoms resolved, at that time HR was back to normal as well as bp - description would suggest a possible symptomatic arrhythmia, would start workup with a 14 day zio  patch - at this time symptoms not consistent with ischemia, do not see indication for ischemic testing at this time.   2. Elevated blood pressure - initial bp elevate,d, manual recheck 138/78. At recent pcp visit normal bp - monitor at this time, would be lenent in bp goals given advanced age and Parkinsons disease with increased risk for postural hypotension.     Arnoldo Lenis, M.D.,

## 2021-06-13 NOTE — Patient Instructions (Addendum)
Medication Instructions:  Continue all current medications.  Labwork: none  Testing/Procedures: Your physician has recommended that you wear a 14 day event monitor. Event monitors are medical devices that record the heart's electrical activity. Doctors most often [place] these monitors to diagnose arrhythmias. Arrhythmias are problems with the speed or rhythm of the heartbeat. The monitor is a small, portable device. You can wear one while you do your normal daily activities. This is usually used to diagnose what is causing palpitations/syncope (passing out). Office will contact with results via phone or letter.     Follow-Up: 3 months   Any Other Special Instructions Will Be Listed Below (If Applicable).   If you need a refill on your cardiac medications before your next appointment, please call your pharmacy.

## 2021-06-14 ENCOUNTER — Ambulatory Visit: Payer: Medicare Other | Admitting: Cardiology

## 2021-06-15 DIAGNOSIS — R000 Tachycardia, unspecified: Secondary | ICD-10-CM | POA: Diagnosis not present

## 2021-06-27 ENCOUNTER — Telehealth: Payer: Self-pay | Admitting: Family Medicine

## 2021-06-27 NOTE — Telephone Encounter (Signed)
FYI- Pt's wife Tim Lair called wanting to inform provider that in 2023 the ins was no longer going to cover the carbidopa-levodopa (SINEMET IR) 25-100 MG tablet. However [employer] and they informed her that it will still be covered, in case we run into this on pt's next refill.

## 2021-06-27 NOTE — Telephone Encounter (Signed)
Noted. We will keep an eye out

## 2021-06-28 ENCOUNTER — Encounter: Payer: Medicare Other | Attending: Physical Medicine & Rehabilitation | Admitting: Physical Medicine & Rehabilitation

## 2021-06-28 ENCOUNTER — Other Ambulatory Visit: Payer: Self-pay

## 2021-06-28 ENCOUNTER — Encounter: Payer: Self-pay | Admitting: Physical Medicine & Rehabilitation

## 2021-06-28 VITALS — BP 152/70 | HR 70 | Ht 68.0 in | Wt 182.0 lb

## 2021-06-28 DIAGNOSIS — M1711 Unilateral primary osteoarthritis, right knee: Secondary | ICD-10-CM | POA: Diagnosis not present

## 2021-06-28 NOTE — Progress Notes (Signed)
Subjective:    Patient ID: Gerald Knapp, male    DOB: 03/04/1931, 87 y.o.   MRN: [id number]  HPI 87 year old male kindly referred by Dr. Suella Broad for the evaluation of right knee pain.  The patient has seen orthopedic surgery Dr. Theda Sers.  He was diagnosed with osteoarthritis of the knee and was given corticosteroid injections.  These were helpful but only lasted a couple weeks.  Then received viscosupplementation with Euflexxa without any significant relief the patient's also had viscosupplementation with gelsyn also without any improvement His other medical problems include lumbar spinal stenosis as well as Parkinson's disease.  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 The patient has a prescription for tramadol but has not taken any of these since receiving this prescription weeks ago. Sharp burning stabbing aching interfering with activity at a moderate level worsens standing walking.  Uses a walking stick to help ambulate.  He is able to climb steps urinary issues Nonprescription medications acetaminophen 650 mg 1 to 2 tablets twice per day Pain Inventory Average Pain 10 Pain Right Now 9 My pain is sharp, burning, stabbing, and aching  In the last 24 hours, has pain interfered with the following? General activity 5 Relation with others 2 Enjoyment of life 5 What TIME of day is your pain at its worst? morning , daytime, evening, and night Sleep (in general) Fair  Pain is worse with: walking, bending, standing, and some activites Pain improves with: medication Relief from Meds: 6  use a cane ability to climb steps?  yes do you drive?  yes  retired  bladder control problems bowel control problems tremor  New pt  New pt    Family History  Problem Relation Age of Onset   Arthritis Mother    Hypertension Mother    Cancer Father    Diabetes Father    Arthritis Father    Social  History   Socioeconomic History   Marital status: Married    Spouse name: Production manager    Number of children: Not on file   Years of education: Not on file   Highest education level: Not on file  Occupational History   Not on file  Tobacco Use   Smoking status: Never   Smokeless tobacco: Never  Substance and Sexual Activity   Alcohol use: Yes    Comment: Occastional Drink    Drug use: Not on file   Sexual activity: Not on file  Other Topics Concern   Not on file  Social History Narrative   Not on file   Social Determinants of Health   Financial Resource Strain: Not on file  Food Insecurity: Not on file  Transportation Needs: Not on file  Physical Activity: Not on file  Stress: Not on file  Social Connections: Not on file   Past Surgical History:  Procedure Laterality Date   CARDIAC CATHETERIZATION     CATARACT EXTRACTION     COLONOSCOPY     HERNIA REPAIR     NOSE SURGERY     Past Medical History:  Diagnosis Date   Allergic rhinitis    Benign prostate hyperplasia    Chronic kidney disease    GERD (gastroesophageal reflux disease)    Hypertension    Tremor    BP (!) 152/70   Pulse 70   Ht 5\' 8"  (1.727 m)   Wt 182 lb (82.6 kg)  SpO2 99%   BMI 27.67 kg/m   Opioid Risk Score:   Fall Risk Score:  `1  Depression screen PHQ 2/9  No flowsheet data found.   Review of Systems  Gastrointestinal:  Positive for constipation.  Musculoskeletal:        Right knee pain  Neurological:  Positive for tremors.  All other systems reviewed and are negative.     Objective:   Physical Exam Vitals and nursing note reviewed.  Constitutional:      Appearance: He is normal weight.  HENT:     Head: Normocephalic and atraumatic.     Mouth/Throat:     Mouth: Mucous membranes are dry.  Eyes:     Extraocular Movements: Extraocular movements intact.     Conjunctiva/sclera: Conjunctivae normal.     Pupils: Pupils are equal, round, and reactive to light.  Cardiovascular:      Rate and Rhythm: Normal rate and regular rhythm.     Heart sounds: Normal heart sounds. No murmur heard. Pulmonary:     Effort: Pulmonary effort is normal.     Breath sounds: Normal breath sounds.  Abdominal:     General: Abdomen is flat. Bowel sounds are normal. There is no distension.     Palpations: Abdomen is soft.  Musculoskeletal:     Cervical back: Normal range of motion.     Comments: Right knee has no evidence of effusion there is enlargement of the tibiofemoral joint.  No evidence of knee effusion no erythema.  Patient has normal range of motion with flexion and extension of the right knee, left knee no evidence of effusion no joint line tenderness no erythema normal range of motion flexion extension. Ambulates with a walking stick he has decreased stance phase on the right side with tendency to have reduced knee flexion on that side as well.  Skin:    General: Skin is warm and dry.  Neurological:     Mental Status: He is alert.     Comments: Motor strength is 5/5 bilateral deltoid, bicep, tricep, grip, hip flexor, knee extensor, ankle dorsiflexor and plantar flexor Negative straight leg raising bilaterally Sensation normal about the knee bilaterally.          Assessment & Plan:   1.  End-stage osteoarthritis right knee chronic moderate pain that inhibits activity level.  He has only very short-term relief with corticosteroids although they are helpful for him.  He has not had any success with viscosupplementation We have discussed treatment options including long-acting corticosteroid such as Zilretta in addition the patient may be a good candidate for genicular nerve blocks right knee under fluoroscopic guidance with potential for radiofrequency neurotomy of these nerves if he gets only short-term relief with the genicular nerve blocks. Would start with Zilretta injection and failing this proceed onto genicular nerve blocks we will schedule back

## 2021-07-05 DIAGNOSIS — R000 Tachycardia, unspecified: Secondary | ICD-10-CM | POA: Diagnosis not present

## 2021-07-10 ENCOUNTER — Encounter (HOSPITAL_BASED_OUTPATIENT_CLINIC_OR_DEPARTMENT_OTHER): Payer: Medicare Other | Admitting: Physical Medicine & Rehabilitation

## 2021-07-10 ENCOUNTER — Encounter: Payer: Self-pay | Admitting: Physical Medicine & Rehabilitation

## 2021-07-10 ENCOUNTER — Other Ambulatory Visit: Payer: Self-pay

## 2021-07-10 VITALS — BP 158/72 | HR 60 | Temp 97.9°F | Ht 68.0 in | Wt 180.0 lb

## 2021-07-10 DIAGNOSIS — M1711 Unilateral primary osteoarthritis, right knee: Secondary | ICD-10-CM

## 2021-07-10 NOTE — Progress Notes (Signed)
Knee injection Right   Indication:RIght Knee pain not relieved by medication management and other conservative care.  Informed consent was obtained after describing risks and benefits of the procedure with the patient, this includes bleeding, bruising, infection and medication side effects. The patient wishes to proceed and has given written consent. The patient was placed in a recumbent position. The medial aspect of the knee was marked and prepped with Betadine and alcohol. It was then entered with a 25-gauge 1-1/2 inch needle and 1 mL of 1% lidocaine was injected into the skin and subcutaneous tissue. Then another 21 g 2" needle was inserted into the knee joint. After negative draw back for blood, a solution of Zilretta ,tiramcinolone 32mg  in 28ml NS. The patient tolerated the procedure well. Post procedure instructions were given.   Discussed that pt should call in 1 wk to inform our office about degree of pain relief.  If no sig relief would proceed to RIght knee genicular nerve blocks under fluor guidance

## 2021-07-10 NOTE — Patient Instructions (Signed)
Please call in one week if not doing any better will plan on Right knee genicular nerve blocks

## 2021-07-12 ENCOUNTER — Encounter: Payer: Medicare Other | Admitting: Physical Medicine & Rehabilitation

## 2021-07-12 NOTE — Telephone Encounter (Signed)
Pt's wife, Gerald Knapp (on [place]) received a call from [facility]. [employer] faxing [employer] a PA  for carbidopa-levodopa (SINEMET CR) 50-200 MG tablet for physician to sign and fax back. Must respond with in 14 days.  She gave me a reference no: [id number]  If you have any questions you can call me back.

## 2021-07-12 NOTE — Telephone Encounter (Signed)
PA paperwork completed and faxed to [facility]. Will await determination

## 2021-07-12 NOTE — Telephone Encounter (Signed)
Received the paperwork yesterday. Completed and as soon as signed will send in for the pt.

## 2021-07-17 ENCOUNTER — Telehealth: Payer: Self-pay

## 2021-07-17 NOTE — Telephone Encounter (Signed)
Patient's wife called for FYI and stated the patient's pain has somewhat subsided. She says his pain may be a 3 or 4. She thinks he should keep the appointment for 08/28/21 to discuss the nerve block.

## 2021-07-31 ENCOUNTER — Telehealth: Payer: Self-pay | Admitting: Neurology

## 2021-07-31 NOTE — Telephone Encounter (Signed)
Completed a new PA for the pt for the new year. Completed through [employer]: [id number] Have to wait 3-7 days for response.  PA denied and will have to completed appeal letter. [employer] denied stating he needs to try Rytary capsule ER first. Will attempt a appeal and forward to insurance

## 2021-08-02 ENCOUNTER — Encounter: Payer: Self-pay | Admitting: Neurology

## 2021-08-02 NOTE — Telephone Encounter (Signed)
Appeal letter was reviewed with [facility] and they have approved the appeal. The med is approved until 07/28/2022

## 2021-08-03 ENCOUNTER — Ambulatory Visit: Payer: Medicare Other | Admitting: Physical Medicine & Rehabilitation

## 2021-08-15 ENCOUNTER — Telehealth: Payer: Self-pay | Admitting: *Deleted

## 2021-08-15 MED ORDER — METOPROLOL TARTRATE 25 MG PO TABS: 12.5000 mg | ORAL_TABLET | Freq: Two times a day (BID) | ORAL | 6 refills | Status: DC

## 2021-08-15 NOTE — Telephone Encounter (Signed)
Laurine Blazer, LPN  1/66/1969 40:98 AM EST Back to Top    Notified, copy to pcp.    Laurine Blazer, LPN  28/67/5198 24:29 AM EST     Left message to return call.   Laurine Blazer, LPN  98/12/9994  7:22 AM EST     No answer.    Arnoldo Lenis, MD  07/23/2021 10:45 AM EST     Heart monitor does show an occasional abnormal heart rhythm called an SVT, This rhythm is not dangerous but can cause symptoms of heart racing, chest pain, SOB. Can we start him on lopressor 12.5mg  bid and monitor symptoms   Zandra Abts MD

## 2021-08-16 ENCOUNTER — Telehealth: Payer: Self-pay | Admitting: Cardiology

## 2021-08-16 NOTE — Telephone Encounter (Signed)
Pt c/o medication issue:  1. Name of Medication: metoprolol tartrate (LOPRESSOR) 25 MG tablet  2. How are you currently taking this medication (dosage and times per day)? Not taking yet  3. Are you having a reaction (difficulty breathing--STAT)? no  4. What is your medication issue? Pts spouse would like to know if this medication is safe to take along w/ her husbands Parkinson's medication... please advise

## 2021-08-16 NOTE — Telephone Encounter (Signed)
Advised its okay to take metoprolol with all other medications including sinemet Verbalized understanding

## 2021-08-16 NOTE — Telephone Encounter (Signed)
Agree ok to take metoprolol  Zandra Abts MD

## 2021-08-28 ENCOUNTER — Ambulatory Visit
Admission: RE | Admit: 2021-08-28 | Discharge: 2021-08-28 | Disposition: A | Discharge status: Home / Self Care | Payer: Medicare Other | Source: Ambulatory Visit | Attending: Physical Medicine & Rehabilitation | Admitting: Physical Medicine & Rehabilitation

## 2021-08-28 ENCOUNTER — Other Ambulatory Visit: Payer: Self-pay

## 2021-08-28 ENCOUNTER — Encounter: Payer: Medicare Other | Attending: Physical Medicine & Rehabilitation | Admitting: Physical Medicine & Rehabilitation

## 2021-08-28 ENCOUNTER — Encounter: Payer: Self-pay | Admitting: Physical Medicine & Rehabilitation

## 2021-08-28 VITALS — BP 120/73 | HR 66 | Ht 68.0 in | Wt 178.8 lb

## 2021-08-28 DIAGNOSIS — M25512 Pain in left shoulder: Secondary | ICD-10-CM | POA: Diagnosis not present

## 2021-08-28 NOTE — Patient Instructions (Signed)
Xray at [place]

## 2021-08-28 NOTE — Progress Notes (Signed)
Subjective:    Patient ID: Gerald Knapp, male    DOB: April 19, 1931, 88 y.o.   MRN: [id number]  HPI   88 yo  male with OA Right knee who was last seen 07/10/21 for Right knee injection.  The patient states that he has had very good relief of his right knee pain since the injection.  He still has 6-8 out of 10 pain but this is mainly for his low back area.  He does have radiation of pain to his legs only when he stands up straight.  He also has lesser pain complaints in the left shoulder.  I reviewed records from [facility].  The patient has severe multilevel lumbar spinal stenosis.  Has been evaluated by orthopedics but not felt to be a good operative candidate In addition he has had x-rays of the left shoulder demonstrating AC joint arthropathy as well as superior migration of the humeral head consistent with chronic rotator cuff tear.  He states he gets some relief with the left shoulder injection performed by orthopedics as well.  Pain Inventory Average Pain 8 Pain Right Now 6 My pain is intermittent and throbbing  In the last 24 hours, has pain interfered with the following? General activity 0 Relation with others 0 Enjoyment of life 0 What TIME of day is your pain at its worst? evening Sleep (in general) Fair  Pain is worse with: walking and standing Pain improves with: rest and medication Relief from Meds: 5  Family History  Problem Relation Age of Onset   Arthritis Mother    Hypertension Mother    Cancer Father    Diabetes Father    Arthritis Father    Social History   Socioeconomic History   Marital status: Married    Spouse name: Production manager    Number of children: Not on file   Years of education: Not on file   Highest education level: Not on file  Occupational History   Not on file  Tobacco Use   Smoking status: Never   Smokeless tobacco: Never  Vaping Use   Vaping Use: Never used  Substance and Sexual Activity   Alcohol use: Yes    Comment: Occastional  Drink    Drug use: Not on file   Sexual activity: Not on file  Other Topics Concern   Not on file  Social History Narrative   Not on file   Social Determinants of Health   Financial Resource Strain: Not on file  Food Insecurity: Not on file  Transportation Needs: Not on file  Physical Activity: Not on file  Stress: Not on file  Social Connections: Not on file   Past Surgical History:  Procedure Laterality Date   CARDIAC CATHETERIZATION     CATARACT EXTRACTION     COLONOSCOPY     HERNIA REPAIR     NOSE SURGERY     Past Surgical History:  Procedure Laterality Date   CARDIAC CATHETERIZATION     CATARACT EXTRACTION     COLONOSCOPY     HERNIA REPAIR     NOSE SURGERY     Past Medical History:  Diagnosis Date   Allergic rhinitis    Benign prostate hyperplasia    Chronic kidney disease    GERD (gastroesophageal reflux disease)    Hypertension    Tremor    BP 120/73    Pulse 66    Ht 5\' 8"  (1.727 m)    Wt 178 lb 12.8 oz (81.1 kg)  SpO2 96%    BMI 27.19 kg/m   Opioid Risk Score:   Fall Risk Score:  `1  Depression screen PHQ 2/9  Depression screen [facility] 2/9 08/28/2021 07/10/2021 06/28/2021  Decreased Interest 0 0 0  Down, Depressed, Hopeless 0 0 0  PHQ - 2 Score 0 0 0  Altered sleeping - - 0  Tired, decreased energy - - 0  Change in appetite - - 0  Feeling bad or failure about yourself  - - 0  Trouble concentrating - - 0  Moving slowly or fidgety/restless - - 0  Suicidal thoughts - - 0  PHQ-9 Score - - 0     Review of Systems  Constitutional: Negative.   HENT: Negative.    Eyes: Negative.   Respiratory: Negative.    Cardiovascular: Negative.   Gastrointestinal: Negative.   Endocrine: Negative.   Genitourinary: Negative.   Musculoskeletal:  Positive for back pain.  Skin: Negative.   Allergic/Immunologic: Negative.   Neurological:  Positive for tremors and weakness.  Hematological: Negative.   Psychiatric/Behavioral: Negative.        Objective:    Physical Exam Vitals and nursing note reviewed.  Constitutional:      Appearance: He is normal weight.  HENT:     Head: Normocephalic and atraumatic.  Eyes:     Extraocular Movements: Extraocular movements intact.     Conjunctiva/sclera: Conjunctivae normal.     Pupils: Pupils are equal, round, and reactive to light.  Musculoskeletal:     Right lower leg: No edema.     Left lower leg: No edema.     Comments: No pain with Michel Bickers maneuver on the left side Negative drop arm test There is no pain or limitation with left shoulder range of motion. In the lumbar area he has tenderness palpation around L4-L5 and S1 paraspinal area. Negative straight leg raising There is no tenderness palpation along the right knee joint line.  There is no evidence of knee effusion.  No erythema.  He has full knee extension mildly limited knee flexion. Ambulates with a cane forward flexed posture   Skin:    General: Skin is warm and dry.  Neurological:     Mental Status: He is alert and oriented to person, place, and time.     Comments: Sensation is equal light touch bilateral lower extremities. Tone is normal bilateral lower extremities Motor strength is 5/5 bilateral hip flexor knee extensor ankle dorsiflexor.  Psychiatric:        Mood and Affect: Mood normal.        Behavior: Behavior normal.          Assessment & Plan:   #1.  Right knee osteoarthritis which was the primary reason for referral.  Responded very well to corticosteroid injection with [facility]estone.  We will schedule him back in 2 months for possible reinjection at that time 2.  Lumbar spinal stenosis severe multilevel with neurogenic claudication when he is sitting upright posture.  He walks with a flexed gait.  I think that would be difficult to reverse at this point even with physical therapy due to chronicity of his issues He has no progressive weakness or bowel or bladder issues.  I would be hesitant in recommending epidural  steroid injections as these were done by Dr. Nelva Bush in the past and were not very helpful. 3.  Left shoulder pain likely chronic rotator cuff issues mainly when he is reaching for heavier type objects and trying to hold them  Recheck shoulder x-rays to see if he is developing some glenohumeral osteoarthritis as well.  Consider reinjection

## 2021-09-04 DIAGNOSIS — L570 Actinic keratosis: Secondary | ICD-10-CM | POA: Diagnosis not present

## 2021-09-04 DIAGNOSIS — D0439 Carcinoma in situ of skin of other parts of face: Secondary | ICD-10-CM | POA: Diagnosis not present

## 2021-09-04 DIAGNOSIS — C44219 Basal cell carcinoma of skin of left ear and external auricular canal: Secondary | ICD-10-CM | POA: Diagnosis not present

## 2021-09-04 DIAGNOSIS — C44329 Squamous cell carcinoma of skin of other parts of face: Secondary | ICD-10-CM | POA: Diagnosis not present

## 2021-09-04 DIAGNOSIS — D485 Neoplasm of uncertain behavior of skin: Secondary | ICD-10-CM | POA: Diagnosis not present

## 2021-09-19 ENCOUNTER — Ambulatory Visit (INDEPENDENT_AMBULATORY_CARE_PROVIDER_SITE_OTHER): Payer: Medicare Other | Admitting: Cardiology

## 2021-09-19 ENCOUNTER — Encounter: Payer: Self-pay | Admitting: Cardiology

## 2021-09-19 VITALS — BP 130/80 | HR 56 | Ht 68.0 in | Wt 176.6 lb

## 2021-09-19 DIAGNOSIS — R002 Palpitations: Secondary | ICD-10-CM

## 2021-09-19 MED ORDER — METOPROLOL TARTRATE 25 MG PO TABS: 12.5000 mg | ORAL_TABLET | Freq: Two times a day (BID) | ORAL | 3 refills | Status: DC

## 2021-09-19 NOTE — Patient Instructions (Signed)

## 2021-09-19 NOTE — Progress Notes (Signed)
Clinical Summary Mr. Hitz is a 88 y.o.male seen today for follow up of the following medical problems.   1. Chest pain/Tachycardia - episode in September - while at home, tightness across top of chest. No other associated symptoms - checked bp 70s/60s, HRs 140s. Sat still for a few minutes and symptoms resolved, lasted 10-15 minutes.  - rechecked viatls when symptoms resolved, HR and bp's were normal   Another episode in October, milder episode. Lasts 1-2 minutes.    - limited exrtion due to spinal stenosis, parkinsons.  -no regular exertional symptoms - EKG from pcp SR, isoalted PVC     06/2021 14 day monitor: rare ectopy, rare runs of SVT up to 2 min 30 seconds - started lopressor 12.5mg  bid.  - no recurrent symptoms.      2. Parkinson's disease   Past Medical History:  Diagnosis Date   Allergic rhinitis    Benign prostate hyperplasia    Chronic kidney disease    GERD (gastroesophageal reflux disease)    Hypertension    Tremor      No Known Allergies   Current Outpatient Medications  Medication Sig Dispense Refill   aspirin 81 MG chewable tablet Chew 81 mg by mouth daily.     carbidopa-levodopa (SINEMET CR) 50-200 MG tablet Take 1 tablet by mouth daily. 90 tablet 1   carbidopa-levodopa (SINEMET IR) 25-100 MG tablet Take 1 tablet by mouth 4 (four) times daily. 360 tablet 1   fluticasone (VERAMYST) 27.5 MCG/SPRAY nasal spray Place 2 sprays into the nose daily.     loratadine (CLARITIN) 10 MG tablet Take 10 mg by mouth daily.     metoprolol tartrate (LOPRESSOR) 25 MG tablet Take 0.5 tablets (12.5 mg total) by mouth 2 (two) times daily. 30 tablet 6   Probiotic Product (PROBIOTIC-10 PO) Take by mouth.     traMADol (ULTRAM) 50 MG tablet Ultram 50 mg tablet  Take 2 tablets 3 times a day by oral route as needed for pain.     No current facility-administered medications for this visit.     Past Surgical History:  Procedure Laterality Date   CARDIAC  CATHETERIZATION     CATARACT EXTRACTION     COLONOSCOPY     HERNIA REPAIR     NOSE SURGERY       No Known Allergies    Family History  Problem Relation Age of Onset   Arthritis Mother    Hypertension Mother    Cancer Father    Diabetes Father    Arthritis Father      Social History Mr. Sibal reports that he has never smoked. He has never used smokeless tobacco. Mr. Loh reports current alcohol use.   Review of Systems CONSTITUTIONAL: No weight loss, fever, chills, weakness or fatigue.  HEENT: Eyes: No visual loss, blurred vision, double vision or yellow sclerae.No hearing loss, sneezing, congestion, runny nose or sore throat.  SKIN: No rash or itching.  CARDIOVASCULAR:per hpi  RESPIRATORY: No shortness of breath, cough or sputum.  GASTROINTESTINAL: No anorexia, nausea, vomiting or diarrhea. No abdominal pain or blood.  GENITOURINARY: No burning on urination, no polyuria NEUROLOGICAL: No headache, dizziness, syncope, paralysis, ataxia, numbness or tingling in the extremities. No change in bowel or bladder control.  MUSCULOSKELETAL: No muscle, back pain, joint pain or stiffness.  LYMPHATICS: No enlarged nodes. No history of splenectomy.  PSYCHIATRIC: No history of depression or anxiety.  ENDOCRINOLOGIC: No reports of sweating, cold or heat intolerance. No  polyuria or polydipsia.  Marland Kitchen   Physical Examination Today's Vitals   09/19/21 0910  BP: 130/80  Pulse: (!) 56  SpO2: 97%  Weight: 176 lb 9.6 oz (80.1 kg)  Height: 5\' 8"  (1.727 m)   Body mass index is 26.85 kg/m.  Gen: resting comfortably, no acute distress HEENT: no scleral icterus, pupils equal round and reactive, no palptable cervical adenopathy,  CV: RRR, no m/r/ gno jvd Resp: Clear to auscultation bilaterally GI: abdomen is soft, non-tender, non-distended, normal bowel sounds, no hepatosplenomegaly MSK: extremities are warm, no edema.  Skin: warm, no rash Neuro:  no focal deficits Psych:  appropriate affect   Diagnostic Studies 06/2021 14 day monitor 14 day monitor Rare supraventricular ectopy in the form of isolated PACs, couplets, triplets. Rare runs of SVT lasting up to 32minutes and 30 seconds Symptoms correlated with sinus rhythm with PVCs Nocturnal bradycardia to 39 bpm     Patch Wear Time:  13 days and 17 hours (2022-11-18T16:40:48-0500 to 2022-12-02T09:52:47-0500)   Patient had a min HR of 39 bpm, max HR of 171 bpm, and avg HR of 65 bpm. Predominant underlying rhythm was Sinus Rhythm. First Degree AV Block was present. 11 Supraventricular Tachycardia runs occurred, the run with the fastest interval lasting 4 beats  with a max rate of 171 bpm, the longest lasting 2 mins 28 secs with an avg rate of 147 bpm. Idioventricular Rhythm was present. Isolated SVEs were rare (<1.0%), SVE Couplets were rare (<1.0%), and SVE Triplets were rare (<1.0%). Isolated VEs were  occasional (4.8%, 61302), VE Couplets were rare (<1.0%, 508), and VE Triplets were rare (<1.0%, 56). Ventricular Bigeminy and Trigeminy were present.      Assessment and Plan   Palpitations - monitor with PACs, PVCs, rare episodes of SVT up to 2 min 30 seconds - symptoms improved on lopressor 12.5mg  bid, will continue curent therapy      F/u 1 year     Arnoldo Lenis, M.D

## 2021-09-20 DIAGNOSIS — C44329 Squamous cell carcinoma of skin of other parts of face: Secondary | ICD-10-CM | POA: Diagnosis not present

## 2021-09-20 DIAGNOSIS — C44219 Basal cell carcinoma of skin of left ear and external auricular canal: Secondary | ICD-10-CM | POA: Diagnosis not present

## 2021-10-02 NOTE — Progress Notes (Signed)
PATIENT: Gerald Knapp DOB: 1930-09-10  REASON FOR VISIT: follow up HISTORY FROM: patient  Chief Complaint  Patient presents with   Follow-up    Rm 1, w wife. Here for 6 month PD f/u. Pt reports having a dizzy spells last night. Has had some dizziness before but worse last night. Overall doing well.     HISTORY OF PRESENT ILLNESS:  10/03/2021 ALL: Gerald Knapp returns for follow up for PD. He continues generic Sinemet 25-100mg  QID. He continues to tolerate it well. Right hand tremor waxes and wanes. He denies any changes in gait or falls. He uses a cane for support. 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 He does endorse having occasional dizzy spells. Frequency is about once per month. He endorses feeling that heart starts to race. He is followed by cardiology. 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 He was started on metoprolol tartrate 12.5mg  BID. He denies difficulty swallowing. Appetite is decreased. Weight has been relatively stable. (10 lbs lost over 2 years). He does drink Ensure. Memory is good.   He is also being followed by Dr Letta Pate for right knee pain d/t osteoarthritis, lumbar spinal stenosis, severe, with multilevel neurogenic claudication and right shoulder pain. He feels pain is fairly well managed.   04/04/2021 ALL:  Gerald Knapp returns for follow up for Parkinson's Disease. He continues generic Sinemet 25-100mg  QID (8am, 12pm, 6:30pm, 10pm). We added Sinemet CR 50-200mg  at bedtime at last visit due to concerns of worsening tremor at night. He does feel that it helped. He is not bothered by tremor at night. He does note waxing and waning tremor through the day but is not overly bothered by this.   Gait is stable. He did have one fall in his garden a few weeks ago while picking tomatoes. He reports leaning forward and losing his balance. No injuries. He feels that he is doing fairly well staying active. 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 He gets tired but does  not feel overly fatigued or short of breath.   Swallowing seems better, rare episodes of coughing. His appetite has decreased. He thinks this is due to having more knee pain. He is working with orthopedics who is recommending knee replacement.   He was seen by PCP in 10/2020, after our last visit. He reports that PCP also heard an irregular heart rhythm. EKG was reportedly normal. BP is usually 140's/70's. He feels it is elevated, today, due to traveling 29/70 in traffic. No palpitations, excessive fatigue, dizziness, shortness of breath or stroke like symptoms.   09/26/2020 ALL:  He returns for follow up on Parkinson's Disease. He is doing well. He continues carbidopa levodopa 25/100mg  QID. He feels that tremor is failry well managed during the day. Right hand tremor seems to be worse just before bedtime. He has a hard time getting to sleep due to tremor. He is sleeping well. He continues to be very careful with eating and drinking. He feels that as long as he eats slowly and is careful with liquids, he does not get choked. He continues to drink a couple of sips of amaretto that seems to help. He was recently seen by PCP following Covid diagnosis in January. He has recovered nicely. He reports PCP noticed an irregular heart rate at follow up. EKG was unremarkable per patient. He does have occasional weakness. He has had some intermittent dizziness over the past month. Uncertain if related to Covid.  No  falls. Gait is stable. He continues to use cane.   03/23/2020 ALL:  Gerald Knapp is a 88 y.o. male here today for follow up for Parkinson's Disease. He continues Sinemet 1 tablet TID. Tremor has worsened slightly over the past 4 months. He feels tremor and tension more in right forearm. He is tolerating Sinemet well with no adverse effects noted. He does continue to notice more saliva production and feels he gets strangled easily. He denies any difficulty eating or swallowing foods/liquids. He feels that he  has more drainage from seasonal allergies and that he gets a little strangled on drainage and saliva. He will sip on a glass of amaretto throughout the day and this prevents him from getting strangled. He feels gait is stable. 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 He did get tripped up in his tomato patch and fell over. Fortunately, no injuries. He uses a walking stick or cane everywhere he goes. He feels that he is eating well but has lost about 11 pounds since last being seen. He feels this is related to his wife no longer cooking full meals like she used to. His wife reports that he has had a decreased appetite recently. He is drinking a high protein Boost drink every day.    HISTORY: (copied from Dr Guadelupe Sabin note on 11/17/2019)  Gerald Knapp is an 88 year old right-handed gentleman with an underlying medical history of reflux disease, lumbar spinal stenosis with chronic low back pain, allergic rhinitis, and BPH, who presents for follow-up consultation of his right-sided parkinsonism.  The patient is accompanied by his wife again today.  I first met him on 08/19/2019 at the request of his primary care physician, at which time he reported a 2 to 33-month history of tremor affecting his right more than left hand.  His exam was in keeping with parkinsonism with right-sided lateralization noted.  He was advised to start a trial of low-dose Sinemet with gradual titration.   His wife called in the interim in February 2021 reporting that he had chest pains and dizziness.  They were not sure if these symptoms came from taking Terazosin.  He was advised to follow-up with primary care physician or proceed to the emergency room should he have chest pain.     Today, 11/17/2019: He reports feeling better with regards to his tremor, he is able to tolerate Sinemet, 1 pill 3 times daily, first pill around 7, second pill between 2 and 3 and last pill around 10 PM.  He feels that his tremor is just a  little bit better and his wife endorses that when he feeds himself he does not shake quite as much.  When he stopped the Terazosin his dizziness improved and he was able to take the Sinemet all along.  He has intermittent constipation, takes a stool softener on a daily basis.  He does not drink a whole lot of water, likes to drink tea or soda. No falls.    The patient's allergies, current medications, family history, past medical history, past social history, past surgical history and problem list were reviewed and updated as appropriate.    Previously:    08/19/19: (He) reports a recent onset right more than left hand tremor, started in or around November 2020.  He reports it is primarily on the right side and happens primarily when he is sitting or when walking.  It does not seem to as noticeable when he holds something.  He does have some fine motor dyscontrol.  He has noticed difficulty swallowing at times, feels like he chokes on his own saliva even at times.  The choking-like sensation happens with liquids and solids.  He sleeps fairly well, he sleeps in a lift chair for the past year because of his low back pain.  He has seen Dr. Tonita Cong for this, he has also had steroid injections under Dr. Nelva Bush, about 3 or 4 altogether with limited success.  He is still active on his farm, they raise cows.  His daughter and son-in-law help on the farm, they stay on the farm.  He has no family history of tremor or Parkinson's disease.  Upon further asking, his wife has noticed change in his walking, he tends to shuffle at times and she has also noticed a more stooped posture.  He walks with a walking stick when he is outside on the farm.  He still uses his tractor and feeds the cattle.  Thankfully, he has not fallen.     REVIEW OF SYSTEMS: Out of a complete 14 system review of symptoms, the patient complains only of the following symptoms, tremor, decreased appetite, left knee pain and all other reviewed systems are  negative.   ALLERGIES: No Known Allergies  HOME MEDICATIONS: Outpatient Medications Prior to Visit  Medication Sig Dispense Refill   acetaminophen (TYLENOL) 650 MG CR tablet Take 650 mg by mouth 2 (two) times daily as needed for pain.     aspirin 81 MG chewable tablet Chew 81 mg by mouth daily.     fluticasone (VERAMYST) 27.5 MCG/SPRAY nasal spray Place 2 sprays into the nose daily.     loratadine (CLARITIN) 10 MG tablet Take 10 mg by mouth daily.     metoprolol tartrate (LOPRESSOR) 25 MG tablet Take 0.5 tablets (12.5 mg total) by mouth 2 (two) times daily. 90 tablet 3   traMADol (ULTRAM) 50 MG tablet Ultram 50 mg tablet  Take 2 tablets 3 times a day by oral route as needed for pain.     carbidopa-levodopa (SINEMET CR) 50-200 MG tablet Take 1 tablet by mouth daily. 90 tablet 1   carbidopa-levodopa (SINEMET IR) 25-100 MG tablet Take 1 tablet by mouth 4 (four) times daily. 360 tablet 1   No facility-administered medications prior to visit.    PAST MEDICAL HISTORY: Past Medical History:  Diagnosis Date   Allergic rhinitis    Benign prostate hyperplasia    Chronic kidney disease    GERD (gastroesophageal reflux disease)    Hypertension    Tremor     PAST SURGICAL HISTORY: Past Surgical History:  Procedure Laterality Date   CARDIAC CATHETERIZATION     CATARACT EXTRACTION     COLONOSCOPY     HERNIA REPAIR     NOSE SURGERY      FAMILY HISTORY: Family History  Problem Relation Age of Onset   Arthritis Mother    Hypertension Mother    Cancer Father    Diabetes Father    Arthritis Father     SOCIAL HISTORY: Social History   Socioeconomic History   Marital status: Married    Spouse name: Lilly    Number of children: Not on file   Years of education: Not on file   Highest education level: Not on file  Occupational History   Not on file  Tobacco Use   Smoking status: Never   Smokeless tobacco: Never  Vaping Use   Vaping Use: Never used  Substance  and Sexual  Activity   Alcohol use: Yes    Comment: Occastional Drink    Drug use: Not on file   Sexual activity: Not on file  Other Topics Concern   Not on file  Social History Narrative   Not on file   Social Determinants of Health   Financial Resource Strain: Not on file  Food Insecurity: Not on file  Transportation Needs: Not on file  Physical Activity: Not on file  Stress: Not on file  Social Connections: Not on file  Intimate Partner Violence: Not on file      PHYSICAL EXAM  Vitals:   10/03/21 1037  BP: (!) 163/78  Pulse: 61  Weight: 177 lb (80.3 kg)  Height: 5\' 8"  (1.727 m)     Body mass index is 26.91 kg/m.  Generalized: Well developed, in no acute distress  Cardiology: irregular rhythm, rate normal, no murmur noted Respiratory: clear to auscultation bilaterally  Neurological examination  Mentation: Alert oriented to time, place, history taking. Follows all commands speech and language fluent Cranial nerve II-XII: Pupils were equal round reactive to light. Extraocular movements were full, visual field were full on confrontational test. Facial sensation and strength were normal. Uvula tongue midline. Head turning and shoulder shrug  were normal and symmetric. Motor: The motor testing reveals 5 over 5 strength of all 4 extremities. Good symmetric motor tone is noted throughout. Resting tremor notes of right hand  Sensory: Sensory testing is intact to soft touch on all 4 extremities. No evidence of extinction is noted.  Coordination: Cerebellar testing reveals good finger-nose-finger and heel-to-shin bilaterally. Rapid alternating movements were smooth. Gait and station: Gait is short, stable with cane, stooped posture Reflexes: Deep tendon reflexes are symmetric and normal bilaterally.   DIAGNOSTIC DATA (LABS, IMAGING, TESTING) - I reviewed patient records, labs, notes, testing and imaging myself where available.  No flowsheet data found.   No results found for: WBC,  HGB, HCT, MCV, PLT No results found for: NA, K, CL, CO2, GLUCOSE, BUN, CREATININE, CALCIUM, PROT, ALBUMIN, AST, ALT, ALKPHOS, BILITOT, GFRNONAA, GFRAA No results found for: CHOL, HDL, LDLCALC, LDLDIRECT, TRIG, CHOLHDL No results found for: HGBA1C No results found for: VITAMINB12 No results found for: TSH     ASSESSMENT AND PLAN 88 y.o. year old male  has a past medical history of Allergic rhinitis, Benign prostate hyperplasia, Chronic kidney disease, GERD (gastroesophageal reflux disease), Hypertension, and Tremor. here with     ICD-10-CM   1. Parkinson's disease ([place])  G20     2. Episode of dizziness  R42     3. Decreased appetite  R63.0     4. Elevated blood pressure reading  R03.0         Gerald Knapp is doing well, overall. He continues to tolerate generic Sinemet. We will continue 25-100mg  QID and 50-200mg  CR dosing at bedtime. He was educated on appropriate timing of medication dosing. We continue to monitor concerns of episodic dizziness. No vertiginous symptoms reported. He was started on metoprolol tartrate 12.5mg  BID about 3 weeks ago. He is uncertain if this has helped as he had another episode last night after leaving a restaurant. BP is elevated today. Manual recheck is 150/80. Wife reports readings are usually normal at home. He will continue to follow cardiology. I have offered to order labs for evaluation, however, he prefers to discuss with PCP at upcoming visit. I have asked his wife to make sure this is mentioned at visit. He will continue regular  activity. Fall precautions reviewed. He will focus on healthy lifestyle habits. He will follow up with Dr. Rexene Alberts in 6 months, sooner if needed.    No orders of the defined types were placed in this encounter.     Meds ordered this encounter  Medications   carbidopa-levodopa (SINEMET CR) 50-200 MG tablet    Sig: Take 1 tablet by mouth daily.    Dispense:  90 tablet    Refill:  1    Order Specific Question:   Supervising  Provider    Answer:   Melvenia Beam [[id number]]   carbidopa-levodopa (SINEMET IR) 25-100 MG tablet    Sig: Take 1 tablet by mouth 4 (four) times daily.    Dispense:  360 tablet    Refill:  1    Order Specific Question:   Supervising Provider    Answer:   Melvenia Beam [[id number]]       LUN GBMBO, FNP-C 10/03/2021, 2:27 PM [facility] [address] [place], [place] [address] [phone]

## 2021-10-03 ENCOUNTER — Ambulatory Visit (INDEPENDENT_AMBULATORY_CARE_PROVIDER_SITE_OTHER): Payer: Medicare Other | Admitting: Family Medicine

## 2021-10-03 ENCOUNTER — Encounter: Payer: Self-pay | Admitting: Family Medicine

## 2021-10-03 VITALS — BP 163/78 | HR 61 | Ht 68.0 in | Wt 177.0 lb

## 2021-10-03 DIAGNOSIS — R42 Dizziness and giddiness: Secondary | ICD-10-CM

## 2021-10-03 DIAGNOSIS — R030 Elevated blood-pressure reading, without diagnosis of hypertension: Secondary | ICD-10-CM

## 2021-10-03 DIAGNOSIS — G20A1 Parkinson's disease without dyskinesia, without mention of fluctuations: Secondary | ICD-10-CM

## 2021-10-03 DIAGNOSIS — G20 Parkinson's disease: Secondary | ICD-10-CM

## 2021-10-03 DIAGNOSIS — R630 Anorexia: Secondary | ICD-10-CM | POA: Diagnosis not present

## 2021-10-03 MED ORDER — CARBIDOPA-LEVODOPA ER 50-200 MG PO TBCR: 1.0000 | EXTENDED_RELEASE_TABLET | Freq: Every day | ORAL | 1 refills | Status: DC

## 2021-10-03 MED ORDER — CARBIDOPA-LEVODOPA 25-100 MG PO TABS: 1.0000 | ORAL_TABLET | Freq: Four times a day (QID) | ORAL | 1 refills | Status: DC

## 2021-10-03 NOTE — Patient Instructions (Addendum)
Below is our plan: ? ?We will continue Sinemet 25-'100mg'$  QID. Continue to follow up with Cardiology for the irregular heart rate. If you experience chest pain seek emergency medical attention and reach out to your cardiologist.  ? ?Please make sure you are staying well hydrated. I recommend 50-60 ounces daily. Well balanced diet and regular exercise encouraged. Consistent sleep schedule with 6-8 hours recommended.  ? ?Please continue follow up with care team as directed.  ? ?Follow up with Dr. Rexene Alberts in 4- 6 months, sooner if needed ? ?You may receive a survey regarding today's visit. I encourage you to leave honest feed back as I do use this information to improve patient care. Thank you for seeing me today!  ?  ?

## 2021-10-19 ENCOUNTER — Telehealth: Payer: Self-pay | Admitting: Family Medicine

## 2021-10-19 NOTE — Telephone Encounter (Signed)
Pt is requesting a refill for carbidopa-levodopa (SINEMET CR) 50-200 MG tablet ?carbidopa-levodopa (SINEMET IR) 25-100 MG tablet. ? ?Pharmacy:  [facility]  ? ?

## 2021-10-22 MED ORDER — CARBIDOPA-LEVODOPA 25-100 MG PO TABS: 1.0000 | ORAL_TABLET | Freq: Four times a day (QID) | ORAL | 1 refills | Status: DC

## 2021-10-22 MED ORDER — CARBIDOPA-LEVODOPA ER 50-200 MG PO TBCR: 1.0000 | EXTENDED_RELEASE_TABLET | Freq: Every day | ORAL | 1 refills | Status: DC

## 2021-10-22 NOTE — Telephone Encounter (Signed)
E-scribed rx's to [facility]. ?

## 2021-10-22 NOTE — Addendum Note (Signed)
Addended by: Wyvonnia Lora on: 10/22/2021 07:44 AM ? ? Modules accepted: Orders ? ?

## 2021-10-23 ENCOUNTER — Other Ambulatory Visit: Payer: Self-pay

## 2021-10-23 ENCOUNTER — Encounter: Payer: Self-pay | Admitting: Physical Medicine & Rehabilitation

## 2021-10-23 ENCOUNTER — Encounter: Payer: Medicare Other | Attending: Physical Medicine & Rehabilitation | Admitting: Physical Medicine & Rehabilitation

## 2021-10-23 VITALS — BP 131/75 | HR 62 | Ht 68.0 in | Wt 176.0 lb

## 2021-10-23 DIAGNOSIS — M1711 Unilateral primary osteoarthritis, right knee: Secondary | ICD-10-CM | POA: Insufficient documentation

## 2021-10-23 NOTE — Patient Instructions (Signed)
Knee Injection ?A knee injection is a procedure to get medicine into your knee joint to relieve the pain, swelling, and stiffness of arthritis. Your health care provider uses a needle to inject medicine, which may also help to lubricate and cushion your knee joint. You may need more than one injection. ?Tell a health care provider about: ?Any allergies you have. ?All medicines you are taking, including vitamins, herbs, eye drops, creams, and over-the-counter medicines. ?Any problems you or family members have had with anesthetic medicines. ?Any blood disorders you have. ?Any surgeries you have had. ?Any medical conditions you have. ?Whether you are pregnant or may be pregnant. ?What are the risks? ?Generally, this is a safe procedure. However, problems may occur, including: ?Infection. ?Bleeding. ?Symptoms that get worse. ?Damage to the area around your knee. ?Allergic reaction to any of the medicines. ?Skin reactions from repeated injections. ?What happens before the procedure? ?Ask your health care provider about: ?Changing or stopping your regular medicines. This is especially important if you are taking diabetes medicines or blood thinners. ?Taking medicines such as aspirin and ibuprofen. These medicines can thin your blood. Do not take these medicines unless your health care provider tells you to take them. ?Taking over-the-counter medicines, vitamins, herbs, and supplements. ?Plan to have a responsible adult take you home from the hospital or clinic. ?What happens during the procedure? ? ?You will sit or lie down in a position for your knee to be treated. ?The skin over your kneecap will be cleaned with a germ-killing soap. ?You will be given a medicine that numbs the area (local anesthetic). You may feel some stinging. ?The medicine will be injected into your knee. The needle is carefully placed between your kneecap and your knee. The medicine is injected into the joint space. ?The needle will be removed at  the end of the procedure. ?A bandage (dressing) may be placed over the injection site. ?The procedure may vary among health care providers and hospitals. ?What can I expect after the procedure? ?Your blood pressure, heart rate, breathing rate, and blood oxygen level will be monitored until you leave the hospital or clinic. ?You may have to move your knee through its full range of motion. This helps to get all the medicine into your joint space. ?You will be watched to make sure that you do not have a reaction to the injected medicine. ?You may feel more pain, swelling, and warmth than you did before the injection. This reaction may last about 1-2 days. ?Follow these instructions at home: ?Medicines ?Take over-the-counter and prescription medicines only as told by your health care provider. ?Ask your health care provider if the medicine prescribed to you requires you to avoid driving or using machinery. ?Do not take medicines such as aspirin and ibuprofen unless your health care provider tells you to take them. ?Injection site care ?Follow instructions from your health care provider about: ?How to take care of your puncture site. ?When and how you should change your dressing. ?When you should remove your dressing. ?Check your injection area every day for signs of infection. Check for: ?More redness, swelling, or pain after 2 days. ?Fluid or blood. ?Pus or a bad smell. ?Warmth. ?Managing pain, stiffness, and swelling ? ?If directed, put ice on the injection area. To do this: ?Put ice in a plastic bag. ?Place a towel between your skin and the bag. ?Leave the ice on for 20 minutes, 2-3 times per day. ?Remove the ice if your skin turns bright red.  This is very important. If you cannot feel pain, heat, or cold, you have a greater risk of damage to the area. ?Do not apply heat to your knee. ?Raise (elevate) the injection area above the level of your heart while you are sitting or lying down. ?General instructions ?If you  were given a dressing, keep it dry until your health care provider says it can be removed. Ask your health care provider when you can start showering or bathing. ?Avoid strenuous activities for as long as directed by your health care provider. Ask your health care provider when you can return to your normal activities. ?Keep all follow-up visits. This is important. You may need more injections. ?Contact a health care provider if you have: ?A fever. ?Warmth in your injection area. ?Fluid, blood, or pus coming from your injection site. ?Symptoms at your injection site that last longer than 2 days after your procedure. ?Get help right away if: ?Your knee turns very red. ?Your knee becomes very swollen. ?Your knee is in severe pain. ?Summary ?A knee injection is a procedure to get medicine into your knee joint to relieve the pain, swelling, and stiffness of arthritis. ?A needle is carefully placed between your kneecap and your knee to inject medicine into the joint space. ?Before the procedure, ask your health care provider about changing or stopping your regular medicines, especially if you are taking diabetes medicines or blood thinners. ?Contact your health care provider if you have any problems or questions after your procedure. ?This information is not intended to replace advice given to you by your health care provider. Make sure you discuss any questions you have with your health care provider. ?Document Revised: 12/29/2019 Document Reviewed: 12/29/2019 ?Elsevier Patient Education ? [place]. ? ?

## 2021-10-23 NOTE — Progress Notes (Signed)
Knee injection Right   Indication:RIght Knee pain not relieved by medication management and other conservative care.  Informed consent was obtained after describing risks and benefits of the procedure with the patient, this includes bleeding, bruising, infection and medication side effects. The patient wishes to proceed and has given written consent. The patient was placed in a recumbent position. The medial aspect of the knee was marked and prepped with Betadine and alcohol. It was then entered with a 25-gauge 1-1/2 inch needle and 1 mL of 1% lidocaine was injected into the skin and subcutaneous tissue. Then another 21 g 2" needle was inserted into the knee joint. After negative draw back for blood, a solution of Zilretta ,tiramcinolone 32mg in 5ml NS. The patient tolerated the procedure well. Post procedure instructions were given.     

## 2021-10-26 ENCOUNTER — Ambulatory Visit: Payer: Medicare Other | Admitting: Physical Medicine & Rehabilitation

## 2021-11-13 DIAGNOSIS — Z13220 Encounter for screening for lipoid disorders: Secondary | ICD-10-CM | POA: Diagnosis not present

## 2021-11-13 DIAGNOSIS — N183 Chronic kidney disease, stage 3 unspecified: Secondary | ICD-10-CM | POA: Diagnosis not present

## 2021-11-13 DIAGNOSIS — K2100 Gastro-esophageal reflux disease with esophagitis, without bleeding: Secondary | ICD-10-CM | POA: Diagnosis not present

## 2021-11-13 DIAGNOSIS — I10 Essential (primary) hypertension: Secondary | ICD-10-CM | POA: Diagnosis not present

## 2021-11-16 DIAGNOSIS — N401 Enlarged prostate with lower urinary tract symptoms: Secondary | ICD-10-CM | POA: Diagnosis not present

## 2021-11-16 DIAGNOSIS — G629 Polyneuropathy, unspecified: Secondary | ICD-10-CM | POA: Diagnosis not present

## 2021-11-16 DIAGNOSIS — M4800 Spinal stenosis, site unspecified: Secondary | ICD-10-CM | POA: Diagnosis not present

## 2021-11-16 DIAGNOSIS — R634 Abnormal weight loss: Secondary | ICD-10-CM | POA: Diagnosis not present

## 2021-11-16 DIAGNOSIS — I10 Essential (primary) hypertension: Secondary | ICD-10-CM | POA: Diagnosis not present

## 2021-11-16 DIAGNOSIS — F5221 Male erectile disorder: Secondary | ICD-10-CM | POA: Diagnosis not present

## 2021-11-16 DIAGNOSIS — M40295 Other kyphosis, thoracolumbar region: Secondary | ICD-10-CM | POA: Diagnosis not present

## 2021-11-16 DIAGNOSIS — G20 Parkinson's disease: Secondary | ICD-10-CM | POA: Diagnosis not present

## 2021-11-22 DIAGNOSIS — K2100 Gastro-esophageal reflux disease with esophagitis, without bleeding: Secondary | ICD-10-CM | POA: Diagnosis not present

## 2021-11-22 DIAGNOSIS — N183 Chronic kidney disease, stage 3 unspecified: Secondary | ICD-10-CM | POA: Diagnosis not present

## 2021-11-22 DIAGNOSIS — D519 Vitamin B12 deficiency anemia, unspecified: Secondary | ICD-10-CM | POA: Diagnosis not present

## 2021-11-22 DIAGNOSIS — D529 Folate deficiency anemia, unspecified: Secondary | ICD-10-CM | POA: Diagnosis not present

## 2021-11-22 DIAGNOSIS — I10 Essential (primary) hypertension: Secondary | ICD-10-CM | POA: Diagnosis not present

## 2021-11-22 DIAGNOSIS — R739 Hyperglycemia, unspecified: Secondary | ICD-10-CM | POA: Diagnosis not present

## 2021-12-11 ENCOUNTER — Encounter: Payer: Medicare Other | Admitting: Physical Medicine & Rehabilitation

## 2022-01-11 ENCOUNTER — Encounter: Payer: Self-pay | Admitting: Physical Medicine & Rehabilitation

## 2022-01-11 ENCOUNTER — Encounter: Payer: Medicare Other | Attending: Physical Medicine & Rehabilitation | Admitting: Physical Medicine & Rehabilitation

## 2022-01-11 VITALS — BP 118/62 | HR 55 | Ht 68.0 in | Wt 170.0 lb

## 2022-01-11 DIAGNOSIS — M1711 Unilateral primary osteoarthritis, right knee: Secondary | ICD-10-CM

## 2022-01-11 DIAGNOSIS — M25512 Pain in left shoulder: Secondary | ICD-10-CM | POA: Diagnosis not present

## 2022-01-11 DIAGNOSIS — M48062 Spinal stenosis, lumbar region with neurogenic claudication: Secondary | ICD-10-CM

## 2022-01-11 NOTE — Progress Notes (Signed)
Subjective:    Patient ID: Gerald Knapp, male    DOB: 25-Jun-1931, 88 y.o.   MRN: [id number]  HPI  88 year old male with history of right knee osteoarthritis which failed to respond to short acting corticosteroids but has responded well with greater than 3 months duration of response to Zilretta injection last performed in March. His other complaints include left shoulder pain we reviewed his shoulder x-ray demonstrating left acromioclavicular arthritis The patient also complains of chronic low back pain.  He had a single injection by Dr. Nelva Bush.  He did have some radiating pain down the lower extremities.  Unfortunately the injection was not helpful. We reviewed lumbar MRI dated 02/28/2018 which showed multilevel spinal stenosis due to disc protrusion plus ligamentum flavum hypertrophy at L3-L4 as well as L4-L5.  At L5-S1 there was some anterolisthesis contributing to stenosis.  Also reviewed CT myelogram which showed similar findings. Pain Inventory Average Pain 6 Pain Right Now 6 My pain is intermittent, sharp, stabbing, and aching  In the last 24 hours, has pain interfered with the following? General activity 6 Relation with others 6 Enjoyment of life 6 What TIME of day is your pain at its worst? evening Sleep (in general) Fair  Pain is worse with: walking and standing Pain improves with: rest, medication, and injections Relief from Meds: 6  Family History  Problem Relation Age of Onset   Arthritis Mother    Hypertension Mother    Cancer Father    Diabetes Father    Arthritis Father    Social History   Socioeconomic History   Marital status: Married    Spouse name: Production manager    Number of children: Not on file   Years of education: Not on file   Highest education level: Not on file  Occupational History   Not on file  Tobacco Use   Smoking status: Never   Smokeless tobacco: Never  Vaping Use   Vaping Use: Never used  Substance and Sexual Activity   Alcohol use: Yes     Comment: Occastional Drink    Drug use: Not on file   Sexual activity: Not on file  Other Topics Concern   Not on file  Social History Narrative   Not on file   Social Determinants of Health   Financial Resource Strain: Not on file  Food Insecurity: Not on file  Transportation Needs: Not on file  Physical Activity: Not on file  Stress: Not on file  Social Connections: Not on file   Past Surgical History:  Procedure Laterality Date   CARDIAC CATHETERIZATION     CATARACT EXTRACTION     COLONOSCOPY     HERNIA REPAIR     NOSE SURGERY     Past Surgical History:  Procedure Laterality Date   CARDIAC CATHETERIZATION     CATARACT EXTRACTION     COLONOSCOPY     HERNIA REPAIR     NOSE SURGERY     Past Medical History:  Diagnosis Date   Allergic rhinitis    Benign prostate hyperplasia    Chronic kidney disease    GERD (gastroesophageal reflux disease)    Hypertension    Tremor    BP 118/62   Pulse (!) 55   Ht '5\' 8"'$  (1.727 m)   Wt 170 lb (77.1 kg)   SpO2 99%   BMI 25.85 kg/m   Opioid Risk Score:   Fall Risk Score:  `1  Depression screen [facility] 2/9  08/28/2021    1:21 PM 07/10/2021    2:43 PM 06/28/2021    2:30 PM  Depression screen PHQ 2/9  Decreased Interest 0 0 0  Down, Depressed, Hopeless 0 0 0  PHQ - 2 Score 0 0 0  Altered sleeping   0  Tired, decreased energy   0  Change in appetite   0  Feeling bad or failure about yourself    0  Trouble concentrating   0  Moving slowly or fidgety/restless   0  Suicidal thoughts   0  PHQ-9 Score   0    Review of Systems  Musculoskeletal:  Positive for back pain and gait problem.       Pain in left shoulder  All other systems reviewed and are negative.      Objective:   Physical Exam Vitals and nursing note reviewed.  Constitutional:      Appearance: He is normal weight.  HENT:     Head: Normocephalic and atraumatic.  Eyes:     Extraocular Movements: Extraocular movements intact.      Conjunctiva/sclera: Conjunctivae normal.     Pupils: Pupils are equal, round, and reactive to light.  Musculoskeletal:     Right lower leg: No edema.     Left lower leg: No edema.     Comments: There is mild tenderness palpation in the lumbar paraspinal area he has limited lumbar flexion approximately 50% lumbar extension cannot get to neutral he is forward flexed.  He has pain going down his leg when he tries to get to a neutral find position. The right knee has no evidence of effusion there is minimal pain to palpation along the medial joint line no tenderness along the lateral joint line or the patellar tendon or the quadricep tendon.  No Baker's cyst.  Skin:    General: Skin is warm and dry.  Neurological:     Mental Status: He is alert and oriented to person, place, and time.     Comments: Motor strength 4+ bilateral hip flexor knee extensor ankle dorsiflexor Negative straight leg raising Ambulates with a crouched type gait using a cane short step length no evidence of foot drag  Psychiatric:        Mood and Affect: Mood normal.        Behavior: Behavior normal.    Left shoulder negative impingement sign Positive pain with AB duction tenderness over the [facility] joint      Assessment & Plan:   1.  Right knee osteoarthritis too early for Zilretta minimum of 3 months between injections, will order and have the patient come back for repeat.  Fortunately he still having good relief of his chronic right knee pain 2.  Evidence of lumbar spinal stenosis on imaging studies correlating to his physical examination and complaints.  Given the degree of stenosis do not think that epidurals would likely be of much benefit.  For his low back pain he may benefit from medial branch blocks patient will consider this. 3.  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  This correlates with examination of pain to palpation over the joint as well as pain with adduction, may benefit from  acromioclavicular injection under ultrasound guidance

## 2022-01-14 ENCOUNTER — Telehealth: Payer: Self-pay | Admitting: Cardiology

## 2022-01-14 MED ORDER — METOPROLOL TARTRATE 25 MG PO TABS: 12.5000 mg | ORAL_TABLET | Freq: Two times a day (BID) | ORAL | 2 refills | Status: DC

## 2022-01-14 NOTE — Telephone Encounter (Signed)
*  STAT* If patient is at the pharmacy, call can be transferred to refill team.   1. Which medications need to be refilled? (please list name of each medication and dose if known) metoprolol tartrate (LOPRESSOR) 25 MG tablet  2. Which pharmacy/location (including street and city if local pharmacy) is medication to be sent to? [place], [place]  3. Do they need a 30 day or 90 day supply? [place]

## 2022-01-14 NOTE — Telephone Encounter (Signed)
Done

## 2022-02-15 ENCOUNTER — Encounter: Payer: Self-pay | Admitting: Physical Medicine & Rehabilitation

## 2022-02-15 ENCOUNTER — Encounter: Payer: Medicare Other | Attending: Physical Medicine & Rehabilitation | Admitting: Physical Medicine & Rehabilitation

## 2022-02-15 VITALS — BP 124/71 | HR 61 | Temp 97.7°F | Ht 68.0 in | Wt 169.0 lb

## 2022-02-15 DIAGNOSIS — M1711 Unilateral primary osteoarthritis, right knee: Secondary | ICD-10-CM | POA: Diagnosis not present

## 2022-02-15 NOTE — Progress Notes (Signed)
Knee injection Right   Indication:RIght Knee pain not relieved by medication management and other conservative care.  Informed consent was obtained after describing risks and benefits of the procedure with the patient, this includes bleeding, bruising, infection and medication side effects. The patient wishes to proceed and has given written consent. The patient was placed in a recumbent position. The medial aspect of the knee was marked and prepped with Betadine and alcohol. It was then entered with a 25-gauge 1-1/2 inch needle and 1 mL of 1% lidocaine was injected into the skin and subcutaneous tissue. Then another 21 g 2" needle was inserted into the knee joint. After negative draw back for blood, a solution of Zilretta ,tiramcinolone 32mg in 5ml NS. The patient tolerated the procedure well. Post procedure instructions were given.     

## 2022-02-15 NOTE — Patient Instructions (Signed)

## 2022-03-05 DIAGNOSIS — L570 Actinic keratosis: Secondary | ICD-10-CM | POA: Diagnosis not present

## 2022-03-08 ENCOUNTER — Ambulatory Visit: Payer: Medicare Other | Admitting: Physical Medicine & Rehabilitation

## 2022-04-04 ENCOUNTER — Encounter: Payer: Medicare Other | Admitting: Physical Medicine & Rehabilitation

## 2022-04-09 ENCOUNTER — Ambulatory Visit: Payer: Medicare Other | Admitting: Neurology

## 2022-05-09 ENCOUNTER — Ambulatory Visit: Payer: Medicare Other | Admitting: Neurology

## 2022-05-21 ENCOUNTER — Encounter: Payer: Self-pay | Admitting: Physical Medicine & Rehabilitation

## 2022-05-21 ENCOUNTER — Encounter: Payer: Medicare Other | Attending: Physical Medicine & Rehabilitation | Admitting: Physical Medicine & Rehabilitation

## 2022-05-21 VITALS — BP 159/68 | HR 56 | Temp 97.7°F | Ht 68.0 in | Wt 168.0 lb

## 2022-05-21 DIAGNOSIS — M1711 Unilateral primary osteoarthritis, right knee: Secondary | ICD-10-CM | POA: Insufficient documentation

## 2022-05-21 MED ORDER — TRIAMCINOLONE ACETONIDE 32 MG IX SRER
32.0000 mg | Freq: Once | INTRA_ARTICULAR | Status: AC
  Administered 2022-05-21: 32 mg via INTRA_ARTICULAR

## 2022-05-21 NOTE — Patient Instructions (Signed)

## 2022-05-21 NOTE — Progress Notes (Signed)
Knee injection Right   Indication:RIght Knee pain not relieved by medication management and other conservative care.  Informed consent was obtained after describing risks and benefits of the procedure with the patient, this includes bleeding, bruising, infection and medication side effects. The patient wishes to proceed and has given written consent. The patient was placed in a recumbent position. The medial aspect of the knee was marked and prepped with Betadine and alcohol. It was then entered with a 25-gauge 1-1/2 inch needle and 1 mL of 1% lidocaine was injected into the skin and subcutaneous tissue. Then another 21 g 2" needle was inserted into the knee joint. After negative draw back for blood, a solution of Zilretta ,tiramcinolone 32mg in 5ml NS. The patient tolerated the procedure well. Post procedure instructions were given.     

## 2022-05-28 ENCOUNTER — Telehealth: Payer: Self-pay | Admitting: Neurology

## 2022-05-28 NOTE — Telephone Encounter (Signed)
LVM and sent mychart msg informing pt of r/s needed for 11/7 appt- MD out.

## 2022-06-03 DIAGNOSIS — Z13220 Encounter for screening for lipoid disorders: Secondary | ICD-10-CM | POA: Diagnosis not present

## 2022-06-03 DIAGNOSIS — K2100 Gastro-esophageal reflux disease with esophagitis, without bleeding: Secondary | ICD-10-CM | POA: Diagnosis not present

## 2022-06-03 DIAGNOSIS — N189 Chronic kidney disease, unspecified: Secondary | ICD-10-CM | POA: Diagnosis not present

## 2022-06-03 DIAGNOSIS — I10 Essential (primary) hypertension: Secondary | ICD-10-CM | POA: Diagnosis not present

## 2022-06-03 DIAGNOSIS — R739 Hyperglycemia, unspecified: Secondary | ICD-10-CM | POA: Diagnosis not present

## 2022-06-04 ENCOUNTER — Ambulatory Visit: Payer: Medicare Other | Admitting: Neurology

## 2022-06-04 DIAGNOSIS — N189 Chronic kidney disease, unspecified: Secondary | ICD-10-CM | POA: Diagnosis not present

## 2022-06-04 DIAGNOSIS — Z13220 Encounter for screening for lipoid disorders: Secondary | ICD-10-CM | POA: Diagnosis not present

## 2022-06-04 DIAGNOSIS — K2100 Gastro-esophageal reflux disease with esophagitis, without bleeding: Secondary | ICD-10-CM | POA: Diagnosis not present

## 2022-06-04 DIAGNOSIS — I10 Essential (primary) hypertension: Secondary | ICD-10-CM | POA: Diagnosis not present

## 2022-06-04 DIAGNOSIS — R739 Hyperglycemia, unspecified: Secondary | ICD-10-CM | POA: Diagnosis not present

## 2022-06-10 DIAGNOSIS — G629 Polyneuropathy, unspecified: Secondary | ICD-10-CM | POA: Diagnosis not present

## 2022-06-10 DIAGNOSIS — M40295 Other kyphosis, thoracolumbar region: Secondary | ICD-10-CM | POA: Diagnosis not present

## 2022-06-10 DIAGNOSIS — F5221 Male erectile disorder: Secondary | ICD-10-CM | POA: Diagnosis not present

## 2022-06-10 DIAGNOSIS — Z6824 Body mass index (BMI) 24.0-24.9, adult: Secondary | ICD-10-CM | POA: Diagnosis not present

## 2022-06-10 DIAGNOSIS — I10 Essential (primary) hypertension: Secondary | ICD-10-CM | POA: Diagnosis not present

## 2022-06-10 DIAGNOSIS — M4800 Spinal stenosis, site unspecified: Secondary | ICD-10-CM | POA: Diagnosis not present

## 2022-06-10 DIAGNOSIS — N401 Enlarged prostate with lower urinary tract symptoms: Secondary | ICD-10-CM | POA: Diagnosis not present

## 2022-06-10 DIAGNOSIS — R634 Abnormal weight loss: Secondary | ICD-10-CM | POA: Diagnosis not present

## 2022-06-10 DIAGNOSIS — Z23 Encounter for immunization: Secondary | ICD-10-CM | POA: Diagnosis not present

## 2022-06-10 DIAGNOSIS — K219 Gastro-esophageal reflux disease without esophagitis: Secondary | ICD-10-CM | POA: Diagnosis not present

## 2022-06-25 ENCOUNTER — Telehealth: Payer: Self-pay | Admitting: Family Medicine

## 2022-06-25 MED ORDER — CARBIDOPA-LEVODOPA 25-100 MG PO TABS: 1.0000 | ORAL_TABLET | Freq: Four times a day (QID) | ORAL | 0 refills | Status: DC

## 2022-06-25 MED ORDER — CARBIDOPA-LEVODOPA ER 50-200 MG PO TBCR: 1.0000 | EXTENDED_RELEASE_TABLET | Freq: Every day | ORAL | 0 refills | Status: DC

## 2022-06-25 NOTE — Telephone Encounter (Signed)
E-scribed refills.  

## 2022-06-25 NOTE — Telephone Encounter (Signed)
Noted  

## 2022-06-25 NOTE — Telephone Encounter (Signed)
At 12:53 wife left a vm that she received an automated message from [facility] that carbidopa-levodopa (SINEMET CR) 50-200 MG tablet & carbidopa-levodopa (SINEMET IR) 25-100 MG tablet have expired.  Pt is in need of a refill of both medications

## 2022-06-25 NOTE — Telephone Encounter (Signed)
Pt's wife has called back to report that she reached out to [facility] and had it confirmed that both  carbidopa-levodopa (SINEMET CR) 50-200 MG tablet & carbidopa-levodopa (SINEMET IR) 25-100 MG tablet  are covered under pt's plan, they will also be covered next year as well.

## 2022-07-11 DIAGNOSIS — H43393 Other vitreous opacities, bilateral: Secondary | ICD-10-CM | POA: Diagnosis not present

## 2022-07-15 ENCOUNTER — Ambulatory Visit: Payer: Medicare Other | Admitting: Neurology

## 2022-07-30 ENCOUNTER — Ambulatory Visit (INDEPENDENT_AMBULATORY_CARE_PROVIDER_SITE_OTHER): Payer: Medicare Other | Admitting: Neurology

## 2022-07-30 ENCOUNTER — Encounter: Payer: Self-pay | Admitting: Neurology

## 2022-07-30 ENCOUNTER — Ambulatory Visit: Payer: Medicare Other | Admitting: Neurology

## 2022-07-30 VITALS — BP 148/69 | HR 58 | Ht 65.5 in | Wt 169.0 lb

## 2022-07-30 DIAGNOSIS — G20A2 Parkinson's disease without dyskinesia, with fluctuations: Secondary | ICD-10-CM | POA: Diagnosis not present

## 2022-07-30 MED ORDER — CARBIDOPA-LEVODOPA 25-100 MG PO TABS: 1.0000 | ORAL_TABLET | Freq: Four times a day (QID) | ORAL | 3 refills | Status: DC

## 2022-07-30 MED ORDER — CARBIDOPA-LEVODOPA ER 50-200 MG PO TBCR: 1.0000 | EXTENDED_RELEASE_TABLET | Freq: Every day | ORAL | 3 refills | Status: DC

## 2022-07-30 NOTE — Progress Notes (Signed)
Subjective:    Patient ID: DANIELLE LENTO is a 89 y.o. male.  HPI    Interim history:   Mr. Hu is an 89 year old right-handed gentleman with an underlying medical history of reflux disease, lumbar spinal stenosis with chronic low back pain, allergic rhinitis, and BPH, who presents for follow-up consultation of his right-sided parkinsonism.  The patient is accompanied by his wife again today.  I last saw him on 11/17/2019, at which time he reported feeling better on Sinemet.  He was taking 1 pill 3 times daily.  His dizziness improved after stopping terazosin.  He saw Debbora Presto, NP on 03/23/2020.  He saw Amy on 09/26/2020 and then again on 04/04/2021.  Most recently, he saw Debbora Presto, NP on 10/03/2021.  His Sinemet IR was increased to 1 pill 4 times daily over time and Sinemet CR was added as well.    Today, 07/30/2022: He reports doing fairly well.  He has had a couple of stumbles.  He has right knee pain and gets 3 monthly injections under Dr. Letta Pate.  He has not actually fallen.  He uses a cane when he goes outside or when he uses the bathroom at night, during the day he may not use the cane.  He feels stable cognitively.  Sinemet IR is 1 pill 4 times a day, he generally takes 1 at 8 AM, second dose around 12:15 PM or 12:30 PM, third dose around 6:30 PM or 7 PM and last dose around 9:30 PM or 10 PM.  He takes the ER around dinnertime along with IR.  The patient's allergies, current medications, family history, past medical history, past social history, past surgical history and problem list were reviewed and updated as appropriate.  Previously (copied from previous notes for reference):   I first met him on 08/19/2019 at the request of his primary care physician, at which time he reported a 2 to 72-monthhistory of tremor affecting his right more than left hand.  His exam was in keeping with parkinsonism with right-sided lateralization noted.  He was advised to start a trial of low-dose Sinemet  with gradual titration.   His wife called in the interim in February 2021 reporting that he had chest pains and dizziness.  They were not sure if these symptoms came from taking Terazosin.  He was advised to follow-up with primary care physician or proceed to the emergency room should he have chest pain.     08/19/19: (He) reports a recent onset right more than left hand tremor, started in or around November 2020.  He reports it is primarily on the right side and happens primarily when he is sitting or when walking.  It does not seem to as noticeable when he holds something.  He does have some fine motor dyscontrol.  He has noticed difficulty swallowing at times, feels like he chokes on his own saliva even at times.  The choking-like sensation happens with liquids and solids.  He sleeps fairly well, he sleeps in a lift chair for the past year because of his low back pain.  He has seen Dr. BTonita Congfor this, he has also had steroid injections under Dr. RNelva Bush about 3 or 4 altogether with limited success.  He is still active on his farm, they raise cows.  His daughter and son-in-law help on the farm, they stay on the farm.  He has no family history of tremor or Parkinson's disease.  Upon further asking, his wife has noticed  change in his walking, he tends to shuffle at times and she has also noticed a more stooped posture.  He walks with a walking stick when he is outside on the farm.  He still uses his tractor and feeds the cattle.  Thankfully, he has not fallen.    His Past Medical History Is Significant For: Past Medical History:  Diagnosis Date   Allergic rhinitis    Benign prostate hyperplasia    Chronic kidney disease    GERD (gastroesophageal reflux disease)    Hypertension    Tremor     His Past Surgical History Is Significant For: Past Surgical History:  Procedure Laterality Date   CARDIAC CATHETERIZATION     CATARACT EXTRACTION     COLONOSCOPY     HERNIA REPAIR     NOSE SURGERY       His Family History Is Significant For: Family History  Problem Relation Age of Onset   Arthritis Mother    Hypertension Mother    Cancer Father    Diabetes Father    Arthritis Father     His Social History Is Significant For: Social History   Socioeconomic History   Marital status: Married    Spouse name: Production manager    Number of children: Not on file   Years of education: Not on file   Highest education level: Not on file  Occupational History   Not on file  Tobacco Use   Smoking status: Never   Smokeless tobacco: Never  Vaping Use   Vaping Use: Never used  Substance and Sexual Activity   Alcohol use: Yes    Comment: rarely a drink   Drug use: Never   Sexual activity: Not on file  Other Topics Concern   Not on file  Social History Narrative   Lives on a farm with his wife. Daughter and son-in-law live on the same farm in a separate house   Right handed   Social Determinants of Health   Financial Resource Strain: Not on file  Food Insecurity: Not on file  Transportation Needs: Not on file  Physical Activity: Not on file  Stress: Not on file  Social Connections: Not on file    His Allergies Are:  Not on File:   His Current Medications Are:  Outpatient Encounter Medications as of 07/30/2022  Medication Sig   acetaminophen (TYLENOL) 650 MG CR tablet Take 650 mg by mouth 2 (two) times daily as needed for pain.   aspirin 81 MG chewable tablet Chew 81 mg by mouth daily.   carbidopa-levodopa (SINEMET CR) 50-200 MG tablet Take 1 tablet by mouth daily.   carbidopa-levodopa (SINEMET IR) 25-100 MG tablet Take 1 tablet by mouth 4 (four) times daily.   fluticasone (FLONASE) 50 MCG/ACT nasal spray Place 1 spray into both nostrils daily.   loratadine (CLARITIN) 10 MG tablet Take 10 mg by mouth daily.   metoprolol tartrate (LOPRESSOR) 25 MG tablet Take 0.5 tablets (12.5 mg total) by mouth 2 (two) times daily.   traMADol (ULTRAM) 50 MG tablet Ultram 50 mg tablet  Take 2  tablets 3 times a day by oral route as needed for pain. (Patient not taking: Reported on 07/30/2022)   No facility-administered encounter medications on file as of 07/30/2022.  :  Review of Systems:  Out of a complete 14 point review of systems, all are reviewed and negative with the exception of these symptoms as listed below:  Review of Systems  Neurological:  Patient is here with his wife for follow-up. He doesn't feel his condition has changed much but is difficult to get up and down. He has trouble with his right knee. He has been getting Zilretta injections in his knee and seeing pain management. He states in the past year he might have stumbled down a time or two but no particular falls he can recall. His wife states overall he is doing well he uses a walking stick. They live on a farm and he goes out to do things on the farm everyday.    Objective:  Neurological Exam  Physical Exam Physical Examination:   Vitals:   07/30/22 1331  BP: (!) 148/69  Pulse: (!) 58    General Examination: The patient is a very pleasant 89 y.o. male in no acute distress. He appears well-developed and well-nourished and well groomed.   HEENT: Normocephalic, atraumatic, pupils are equal, round and reactive to light, tracking is mildly impaired, he wears corrective eyeglasses, he is hard of hearing, no hearing aids.  Face is symmetric, mild facial masking noted, mild nuchal rigidity noted, no lip, neck or jaw tremor, airway examination reveals moderate mouth dryness, no obvious dysarthria, perhaps mild hypophonia, no voice tremor, tongue protrudes centrally in palate elevates symmetrically. There are no carotid bruits on auscultation.    Chest: Clear to auscultation without wheezing, rhonchi or crackles noted.   Heart: S1+S2+0, regular and normal without murmurs, rubs or gallops noted.    Abdomen: Soft, non-tender and non-distended with normal bowel sounds appreciated on auscultation.    Extremities: There is 1+ to 2+ pitting edema in the distal lower extremities bilaterally.    Skin: Warm and dry without trophic changes noted.   Musculoskeletal: exam reveals arthritic changes in both hands, increase in lumbar kyphosis when he stands, medial right knee swelling.   Neurologically:  Mental status: The patient is awake, alert and oriented in all 4 spheres. His immediate and remote memory, attention, language skills and fund of knowledge are appropriate. There is no evidence of aphasia, agnosia, apraxia or anomia. Speech is clear with normal prosody and enunciation. Thought process is linear. Mood is normal and affect is normal.  Cranial nerves II - XII are as described above under HEENT exam. In addition: shoulder shrug is normal with equal shoulder height noted. Motor exam: Normal bulk, strength for age noted. There is no drift, or rebound. Romberg is Not tested for safety concerns.    (On 08/19/2019: On Archimedes spiral drawing, he has mild insecurity with both hands but no obvious trembling noted, handwriting is legible, not particularly tremulous with the right hand but small.)   He has a mild resting tremor in the right upper extremity only. The tremor is fairly consistent.  Mild postural tremor, no significant left upper extremity tremor today.  No obvious lower extremity tremor.  Tone is increased on the right upper extremity with mild cogwheeling noted, normal tone in the LUE. Overall mild bradykinesia noted.  Fine motor skills are mildly impaired to moderately impaired in the right upper and right lower extremities, better on the left. Increase in lumbar kyphosis noted.  He uses a single-point wooden cane to walk.     Assessment and Plan:    In summary, Georgie Haque is an 89 year old male with an underlying medical history of reflux disease, right knee arthritis, lumbar spinal stenosis with chronic low back pain, allergic rhinitis, and BPH, who presents for FU consultation  of his right-sided predominant Parkinson's disease,  on Sinemet since January 2021 with tolerance and improvement in the tremor noted by pt and on exam. He had dizziness in the interim, deemed secondary to terazosin.  His dizziness improved after he came off of it.  He is advised to continue with Sinemet 1 pill 4 times daily for now.  He is advised to take it 4 hourly starting at 8 AM.  He is furthermore advised to take the Sinemet CR at bedtime.  He is agreeable to this modification.  We talked about the importance of fall prevention, maintaining a healthy lifestyle, staying well-hydrated.  He is advised to follow-up with Debbora Presto, NP routinely in 6 months, sooner if needed.  I renewed his prescriptions for Sinemet CR and IR, both are generic. I answered all the questions today and the patient and his wife were in agreement with the plan. I spent 30 minutes in total face-to-face time and in reviewing records during pre-charting, more than 50% of which was spent in counseling and coordination of care, reviewing test results, reviewing medications and treatment regimen and/or in discussing or reviewing the diagnosis of PD, the prognosis and treatment options. Pertinent laboratory and imaging test results that were available during this visit with the patient were reviewed by me and considered in my medical decision making (see chart for details).

## 2022-07-30 NOTE — Telephone Encounter (Signed)
I gave AVS to pt and wife.  I relayed that 2 prescriptions were sent in for him of sinemet IR and ER to [facility].  (90 day supply).  They said they did not need and when they do need them will need to go to [facility].  I relayed that  I will cancel them at [facility].  I spoke to pharmacist and relayed this and she will cancel the order.  Pt and wife aware of the times that he is to take the sinemet (noted on AVS).    Acupuncturist

## 2022-07-30 NOTE — Patient Instructions (Signed)
It was nice to see you both again today.  Please continue with your Sinemet IR 1 pill 4 times a day but take 1 pill every 4 hours, at 8 AM, 12, 4 PM and 8 PM daily. Please take your long-acting Sinemet at bedtime around 9:30 PM or 10 PM daily.  Follow-up in about 6 months to see Amy.

## 2022-08-19 DIAGNOSIS — M1711 Unilateral primary osteoarthritis, right knee: Secondary | ICD-10-CM | POA: Diagnosis not present

## 2022-08-22 ENCOUNTER — Encounter: Payer: Medicare Other | Attending: Physical Medicine & Rehabilitation | Admitting: Physical Medicine & Rehabilitation

## 2022-08-22 ENCOUNTER — Encounter: Payer: Self-pay | Admitting: Physical Medicine & Rehabilitation

## 2022-08-22 VITALS — BP 155/75 | HR 59 | Ht 65.5 in | Wt 173.0 lb

## 2022-08-22 DIAGNOSIS — M1711 Unilateral primary osteoarthritis, right knee: Secondary | ICD-10-CM

## 2022-08-22 MED ORDER — TRIAMCINOLONE ACETONIDE 32 MG IX SRER
32.0000 mg | Freq: Once | INTRA_ARTICULAR | Status: AC
  Administered 2022-08-22: 32 mg via INTRA_ARTICULAR

## 2022-08-22 NOTE — Progress Notes (Signed)
Knee injection Right   Indication:RIght Knee pain not relieved by medication management and other conservative care.  Informed consent was obtained after describing risks and benefits of the procedure with the patient, this includes bleeding, bruising, infection and medication side effects. The patient wishes to proceed and has given written consent. The patient was placed in a recumbent position. The medial aspect of the knee was marked and prepped with Betadine and alcohol. It was then entered with a 25-gauge 1-1/2 inch needle and 1 mL of 1% lidocaine was injected into the skin and subcutaneous tissue. Then another 21 g 2" needle was inserted into the knee joint. After negative draw back for blood, a solution of Zilretta ,tiramcinolone '32mg'$  in 41m NS. The patient tolerated the procedure well. Post procedure instructions were given.

## 2022-08-22 NOTE — Patient Instructions (Signed)

## 2022-09-02 ENCOUNTER — Telehealth: Payer: Self-pay | Admitting: Physical Medicine & Rehabilitation

## 2022-09-02 NOTE — Telephone Encounter (Signed)
Lily called and said that Gerald Knapp is having severe pain in his knee. She said its so bad he's having to use a walker around the house. She wanted to know more about the nerve block Dr Letta Pate talked to them about. She wanted to know if someone could please give her a call.

## 2022-09-06 NOTE — Telephone Encounter (Signed)
Scheduled proc. Please call to go over pre-procedure instructions.

## 2022-09-11 ENCOUNTER — Other Ambulatory Visit: Payer: Self-pay | Admitting: *Deleted

## 2022-09-11 DIAGNOSIS — G20A2 Parkinson's disease without dyskinesia, with fluctuations: Secondary | ICD-10-CM

## 2022-09-11 DIAGNOSIS — L570 Actinic keratosis: Secondary | ICD-10-CM | POA: Diagnosis not present

## 2022-09-11 DIAGNOSIS — D485 Neoplasm of uncertain behavior of skin: Secondary | ICD-10-CM | POA: Diagnosis not present

## 2022-09-11 DIAGNOSIS — D044 Carcinoma in situ of skin of scalp and neck: Secondary | ICD-10-CM | POA: Diagnosis not present

## 2022-09-11 MED ORDER — CARBIDOPA-LEVODOPA ER 50-200 MG PO TBCR: 1.0000 | EXTENDED_RELEASE_TABLET | Freq: Every day | ORAL | 1 refills | Status: DC

## 2022-09-12 ENCOUNTER — Encounter: Payer: Self-pay | Admitting: Physical Medicine & Rehabilitation

## 2022-09-12 ENCOUNTER — Encounter: Payer: Medicare Other | Attending: Physical Medicine & Rehabilitation | Admitting: Physical Medicine & Rehabilitation

## 2022-09-12 VITALS — BP 147/75 | HR 67 | Ht 65.5 in | Wt 173.0 lb

## 2022-09-12 DIAGNOSIS — M1711 Unilateral primary osteoarthritis, right knee: Secondary | ICD-10-CM | POA: Diagnosis not present

## 2022-09-12 MED ORDER — LIDOCAINE HCL 1 % IJ SOLN
10.0000 mL | Freq: Once | INTRAMUSCULAR | Status: AC
  Administered 2022-10-11: 10 mL

## 2022-09-12 MED ORDER — BUPIVACAINE HCL (PF) 0.5 % IJ SOLN
4.5000 mL | Freq: Once | INTRAMUSCULAR | Status: AC
  Administered 2022-09-12: 4.5 mL

## 2022-09-12 NOTE — Progress Notes (Signed)
  PROCEDURE RECORD [facility]   Name: Gerald Knapp DOB:1931/06/17 MRN: [id number]  Date:09/12/2022  Physician: Alysia Penna, MD    Nurse/CMA: Truman Hayward, CMA  Allergies: Not on File  Consent Signed: Yes.    Is patient diabetic? No.  CBG today? .  Pregnant: No. LMP: No LMP for male patient. (age [age])  Anticoagulants: no Anti-inflammatory: yes (celebrex-last week) Antibiotics: no  Procedure: Right Genicular   Position: Supine Start Time: 2:57 pm  End Time: 3:07 pm  Fluoro Time: 25  RN/CMA Truman Hayward, CMA Gerald Knapp, CMA    Time 2:41 pm 3:18 pm    BP 147/75 179/79    Pulse 67 55    Respirations 14 14    O2 Sat 91 98    S/S 6 6    Pain Level 8/10 0/10     D/C home with wife, patient A & O X 3, D/C instructions reviewed, and sits independently.

## 2022-09-12 NOTE — Progress Notes (Signed)
Genicular nerve block x 3, Upper medial, Upper lateral , and Lower Medial under fluoroscopic guidance  Indication Chronic post operative pain in the Knee, pain postop total knee replacement which has not responded to conservative management such as physical therapy and medication management  Informed consent was obtained after describing risks and to the procedure to the patient these include bleeding bruising and infection, patient elects to proceed and has given written consent. Patient placed supine on the fluoroscopy table AP images of the knee joint were obtained. A 25-gauge 1.5 inch needle was used to anesthetize the skin and subcutaneous tissue with 1% lidocaine, 1.5 cc at each of 3 locations. Then a 22-gauge 3.5" spinal needle was inserted targeting the junction of the medial flare of the tibia with the shaft of the tibia, bone contact made and confirmed with lateral imaging. Then Omnipaque 180 x0.5 mL demonstrated no intravascular uptake followed by injection of 1.57m .5% bupivacaine. Then the junction of the medial epicondyles of the femur with the femoral shaft was targeted needle was advanced under fluoroscopic guidance until bone contact. Appropriate depth was obtained and confirmed with lateral images. Then Omnipaque 180 x0.5 mL demonstrated no intravascular uptake followed by injection of 1.521mof .5% bupivacaine. Then the junction of the lateral femoral condyle with the femoral shaft was targeted. 22-gauge 3.5 inch needle was advanced under fluoroscopic guidance until bone contact. Appropriate depth was confirmed with lateral imaging. 0.5 mL of Omnipaque 180 injected followed by injection of 1.5 cc of .5% bupivacaine solution. Patient tolerated procedure well. Post procedure instructions given  Lidocaine 1% with preservative 4.83m52mmnipaque 180 1.83ml32mpivacaine 0.5% 4.83ml62m

## 2022-09-12 NOTE — Patient Instructions (Signed)
RIght knee nerve block today , please keep track of pain today and how far you can walk.

## 2022-09-16 ENCOUNTER — Other Ambulatory Visit: Payer: Self-pay | Admitting: *Deleted

## 2022-09-16 DIAGNOSIS — G20A2 Parkinson's disease without dyskinesia, with fluctuations: Secondary | ICD-10-CM

## 2022-09-16 MED ORDER — CARBIDOPA-LEVODOPA 25-100 MG PO TABS: 1.0000 | ORAL_TABLET | Freq: Four times a day (QID) | ORAL | 2 refills | Status: DC

## 2022-09-17 ENCOUNTER — Telehealth: Payer: Self-pay | Admitting: Physical Medicine & Rehabilitation

## 2022-09-17 NOTE — Telephone Encounter (Signed)
She wondered if a custom fitted knee brace would work for him. She said his knee keeps trying to give out on him. They still want the injection, she's just wanting something to help keep it stabilized. The place the wife is going to for hers is Dr. Linton Flemings at [facility] in [place].

## 2022-09-17 NOTE — Telephone Encounter (Signed)
Called patient and gave information about being fit for a knee brace. He will call [facility] to see if they accept his insurance and call back in a day or two. Patient states nerve block done last week only lasted a few hours and he has been in pain daily.

## 2022-09-18 ENCOUNTER — Telehealth: Payer: Self-pay | Admitting: *Deleted

## 2022-09-18 NOTE — Telephone Encounter (Signed)
Please order eval for knee brace fit. Patient's spouse called back and [facility] is in network with their ins. Thanks

## 2022-09-18 NOTE — Telephone Encounter (Signed)
Sent to admin to schedule

## 2022-09-19 ENCOUNTER — Encounter: Payer: Self-pay | Admitting: *Deleted

## 2022-09-19 ENCOUNTER — Ambulatory Visit: Payer: Medicare Other | Attending: Cardiology | Admitting: Cardiology

## 2022-09-19 ENCOUNTER — Encounter: Payer: Self-pay | Admitting: Cardiology

## 2022-09-19 ENCOUNTER — Encounter: Payer: Self-pay | Admitting: Physical Medicine & Rehabilitation

## 2022-09-19 VITALS — BP 120/84 | Ht 67.0 in | Wt 169.0 lb

## 2022-09-19 DIAGNOSIS — R000 Tachycardia, unspecified: Secondary | ICD-10-CM | POA: Insufficient documentation

## 2022-09-19 DIAGNOSIS — I471 Supraventricular tachycardia, unspecified: Secondary | ICD-10-CM | POA: Insufficient documentation

## 2022-09-19 DIAGNOSIS — M1711 Unilateral primary osteoarthritis, right knee: Secondary | ICD-10-CM | POA: Insufficient documentation

## 2022-09-19 DIAGNOSIS — R002 Palpitations: Secondary | ICD-10-CM

## 2022-09-19 DIAGNOSIS — C4442 Squamous cell carcinoma of skin of scalp and neck: Secondary | ICD-10-CM | POA: Diagnosis not present

## 2022-09-19 NOTE — Patient Instructions (Addendum)

## 2022-09-19 NOTE — Telephone Encounter (Signed)
Patient's wife called back and asked if order for knee brace could be sent to [facility] could be sent to the clinic in [place], [place]?

## 2022-09-19 NOTE — Progress Notes (Signed)
Clinical Summary Gerald Knapp is a 89 y.o.male seen today for follow up of the following medical problems.    1. Chest pain/Tachycardia - prior while at home, tightness across top of chest. No other associated symptoms - checked bp 70s/60s, HRs 140s. Sat still for a few minutes and symptoms resolved, lasted 10-15 minutes.  - rechecked viatls when symptoms resolved, HR and bp's were normal   Another episode in October, milder episode. Lasts 1-2 minutes.    - limited exrtion due to spinal stenosis, parkinsons.  -no regular exertional symptoms - EKG from pcp SR, isoalted PVC     06/2021 14 day monitor: rare ectopy, rare runs of SVT up to 2 min 30 seconds - started lopressor 12.33m bid.   - no recent palpitations - compliant with meds     2. Parkinson's disease Past Medical History:  Diagnosis Date   Allergic rhinitis    Benign prostate hyperplasia    Chronic kidney disease    GERD (gastroesophageal reflux disease)    Hypertension    Tremor      Not on File   Current Outpatient Medications  Medication Sig Dispense Refill   acetaminophen (TYLENOL) 650 MG CR tablet Take 650 mg by mouth 2 (two) times daily as needed for pain.     aspirin 81 MG chewable tablet Chew 81 mg by mouth daily.     carbidopa-levodopa (SINEMET CR) 50-200 MG tablet Take 1 tablet by mouth daily. 90 tablet 1   carbidopa-levodopa (SINEMET IR) 25-100 MG tablet Take 1 tablet by mouth 4 (four) times daily. Take at 8 AM, 12, 4 PM and 8 PM daily. 360 tablet 2   celecoxib (CELEBREX) 200 MG capsule Take 200 mg by mouth 2 (two) times daily.     fluticasone (FLONASE) 50 MCG/ACT nasal spray Place 1 spray into both nostrils daily.     loratadine (CLARITIN) 10 MG tablet Take 10 mg by mouth daily.     metoprolol tartrate (LOPRESSOR) 25 MG tablet Take 0.5 tablets (12.5 mg total) by mouth 2 (two) times daily. 90 tablet 2   traMADol (ULTRAM) 50 MG tablet      Current Facility-Administered Medications   Medication Dose Route Frequency Provider Last Rate Last Admin   lidocaine (XYLOCAINE) 1 % (with pres) injection 10 mL  10 mL Other Once Kirsteins, ALuanna Salk MD         Past Surgical History:  Procedure Laterality Date   CARDIAC CATHETERIZATION     CATARACT EXTRACTION     COLONOSCOPY     HERNIA REPAIR     NOSE SURGERY       Not on File    Family History  Problem Relation Age of Onset   Arthritis Mother    Hypertension Mother    Cancer Father    Diabetes Father    Arthritis Father      Social History Gerald Knapp that he has never smoked. He has never used smokeless tobacco. Gerald Knapp current alcohol use.   Review of Systems CONSTITUTIONAL: No weight loss, fever, chills, weakness or fatigue.  HEENT: Eyes: No visual loss, blurred vision, double vision or yellow sclerae.No hearing loss, sneezing, congestion, runny nose or sore throat.  SKIN: No rash or itching.  CARDIOVASCULAR: per hpi RESPIRATORY: No shortness of breath, cough or sputum.  GASTROINTESTINAL: No anorexia, nausea, vomiting or diarrhea. No abdominal pain or blood.  GENITOURINARY: No burning on urination, no polyuria NEUROLOGICAL: No headache, dizziness,  syncope, paralysis, ataxia, numbness or tingling in the extremities. No change in bowel or bladder control.  MUSCULOSKELETAL: No muscle, back pain, joint pain or stiffness.  LYMPHATICS: No enlarged nodes. No history of splenectomy.  PSYCHIATRIC: No history of depression or anxiety.  ENDOCRINOLOGIC: No reports of sweating, cold or heat intolerance. No polyuria or polydipsia.  Marland Kitchen   Physical Examination Today's Vitals   09/19/22 1605  BP: 120/84  SpO2: 99%  Weight: 169 lb (76.7 kg)  Height: 5' 7"$  (1.702 m)   Body mass index is 26.47 kg/m.  Gen: resting comfortably, no acute distress HEENT: no scleral icterus, pupils equal round and reactive, no palptable cervical adenopathy,  CV: RRR, no m/rg, no jvd Resp: Clear to auscultation  bilaterally GI: abdomen is soft, non-tender, non-distended, normal bowel sounds, no hepatosplenomegaly MSK: extremities are warm, no edema.  Skin: warm, no rash Neuro:  no focal deficits Psych: appropriate affect   Diagnostic Studies  06/2021 14 day monitor 14 day monitor Rare supraventricular ectopy in the form of isolated PACs, couplets, triplets. Rare runs of SVT lasting up to 22mnutes and 30 seconds Symptoms correlated with sinus rhythm with PVCs Nocturnal bradycardia to 39 bpm     Patch Wear Time:  13 days and 17 hours (2022-11-18T16:40:48-0500 to 2022-12-02T09:52:47-0500)   Patient had a min HR of 39 bpm, max HR of 171 bpm, and avg HR of 65 bpm. Predominant underlying rhythm was Sinus Rhythm. First Degree AV Block was present. 11 Supraventricular Tachycardia runs occurred, the run with the fastest interval lasting 4 beats  with a max rate of 171 bpm, the longest lasting 2 mins 28 secs with an avg rate of 147 bpm. Idioventricular Rhythm was present. Isolated SVEs were rare (<1.0%), SVE Couplets were rare (<1.0%), and SVE Triplets were rare (<1.0%). Isolated VEs were  occasional (4.8%, 61302), VE Couplets were rare (<1.0%, 508), and VE Triplets were rare (<1.0%, 56). Ventricular Bigeminy and Trigeminy were present.         Assessment and Plan   Palpitations - monitor with PACs, PVCs, rare episodes of SVT up to 2 min 30 seconds - has done very well on low dose lopressor without recurrent symptmos - continue current meds -EKG today shows NSR  F/u 1 year  JArnoldo Lenis M.D.,

## 2022-09-24 NOTE — Telephone Encounter (Signed)
[facility] was faxed per front office staff. Nothing further needed.

## 2022-09-26 NOTE — Telephone Encounter (Signed)
Rx faxed

## 2022-09-29 ENCOUNTER — Other Ambulatory Visit: Payer: Self-pay | Admitting: Cardiology

## 2022-10-08 ENCOUNTER — Ambulatory Visit: Payer: Medicare Other | Admitting: Physical Medicine & Rehabilitation

## 2022-10-10 NOTE — Progress Notes (Signed)
  PROCEDURE RECORD [facility]   Name: Gerald Knapp DOB:1931/01/18 MRN: [id number]  Date:10/10/2022  Physician: Alysia Penna, MD    Nurse/CMA:Colyn Miron Jerline Pain MA  Allergies: Not on File  Consent Signed: Yes.    Is patient diabetic? No.  CBG today? N/a  Pregnant: No. LMP: No LMP for male patient. (age [age])  Anticoagulants: no Anti-inflammatory: no Antibiotics: no  Procedure: Right Knee Radiofrequency  Position: Supine Start Time: 10:59  End Time: 11:20  Fluoro Time: 35  RN/CMA Ceana Fiala MA Deara Bober MA    Time 10:29 11:21    BP 186/78 178/88    Pulse 55 52    Respirations 16 16    O2 Sat 97 96    S/S 6 6    Pain Level 0 0     D/C home with spouse and daughter, patient A & O X 3, D/C instructions reviewed, and sits independently.

## 2022-10-11 ENCOUNTER — Encounter: Payer: Medicare Other | Attending: Physical Medicine & Rehabilitation | Admitting: Physical Medicine & Rehabilitation

## 2022-10-11 ENCOUNTER — Encounter: Payer: Self-pay | Admitting: Physical Medicine & Rehabilitation

## 2022-10-11 VITALS — Temp 98.3°F

## 2022-10-11 DIAGNOSIS — M1711 Unilateral primary osteoarthritis, right knee: Secondary | ICD-10-CM | POA: Insufficient documentation

## 2022-10-11 MED ORDER — LIDOCAINE HCL (PF) 2 % IJ SOLN
5.0000 mL | Freq: Once | INTRAMUSCULAR | Status: AC
  Administered 2022-10-11: 5 mL

## 2022-10-11 MED ORDER — LIDOCAINE HCL 1 % IJ SOLN
10.0000 mL | Freq: Once | INTRAMUSCULAR | Status: AC
  Administered 2022-10-11: 10 mL

## 2022-10-11 NOTE — Patient Instructions (Signed)
Genicular RFA right side performed today. This is to block pain signals from the knee. A local anesthetic was used to block the knee prior to performing Radiofrequency neurotomy

## 2022-10-11 NOTE — Progress Notes (Signed)
thermal genicular nerve radiofrequency neurotomy under fluoroscopic guidance  Indication Chronic severe  knee pain that has not responded to PT, medication, and other conservative care.THere has been 50% relief of usual knee pain with genicular nerve blocks under fluoroscopic guidance  Informed consent was obtained after discussing risks and benefits of procedure with the patient.  These include bleeding, bruising and infection as well as foot numbness. Pt place in supine position on the fluoro table .  Static images identified distal femur and proximal tibia.  Lateral and medial supracondylar , as well as medial tibial flare prepped with betadine.  Marked under fluoro and then a 25 g 1.5 inch needle was used to anesthetize skin and subcutaneous tissue. 1.5 cc was infiltrate into each of 3 sites. Then a 18-gauge 10cm RF needle with a 35mm active  tip  was inserted under fluoroscopic guidance first targeting the medial tibial flare midpoint. After bone contact was made lateral images confirmed proper positioning at midpoint of shart ,then Motor stim at 2 Hz demonstrated no motor twitch followed by injection of 26ml of 2% lidocaine and radiofrequency lesioning 80 deg for 80 sec. . This same procedure was treated for the lateral and medial supracondylar areas using same equipment and technique. Patient tolerated procedure well. Post procedure instructions given.

## 2022-10-24 IMAGING — CR DG SHOULDER 2+V*L*
3 series · 3 of 3 positions shown · non-contrast
Comparison: None.

CLINICAL DATA: Left shoulder pain.

EXAM:
LEFT SHOULDER - 2+ VIEW

[w shoulder grashey left]
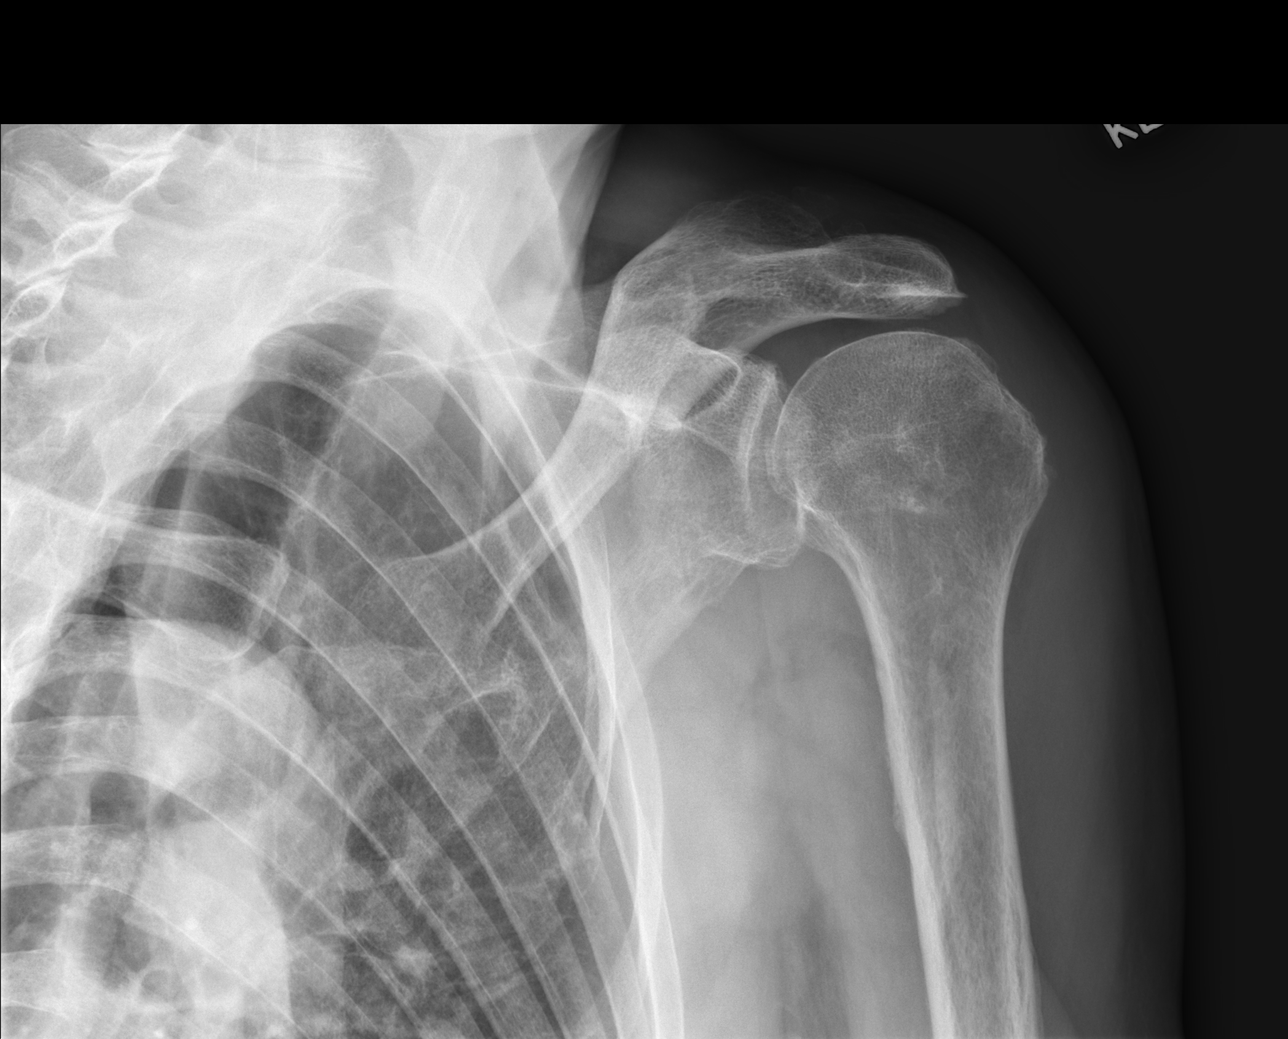

[w shoulder y-view left]
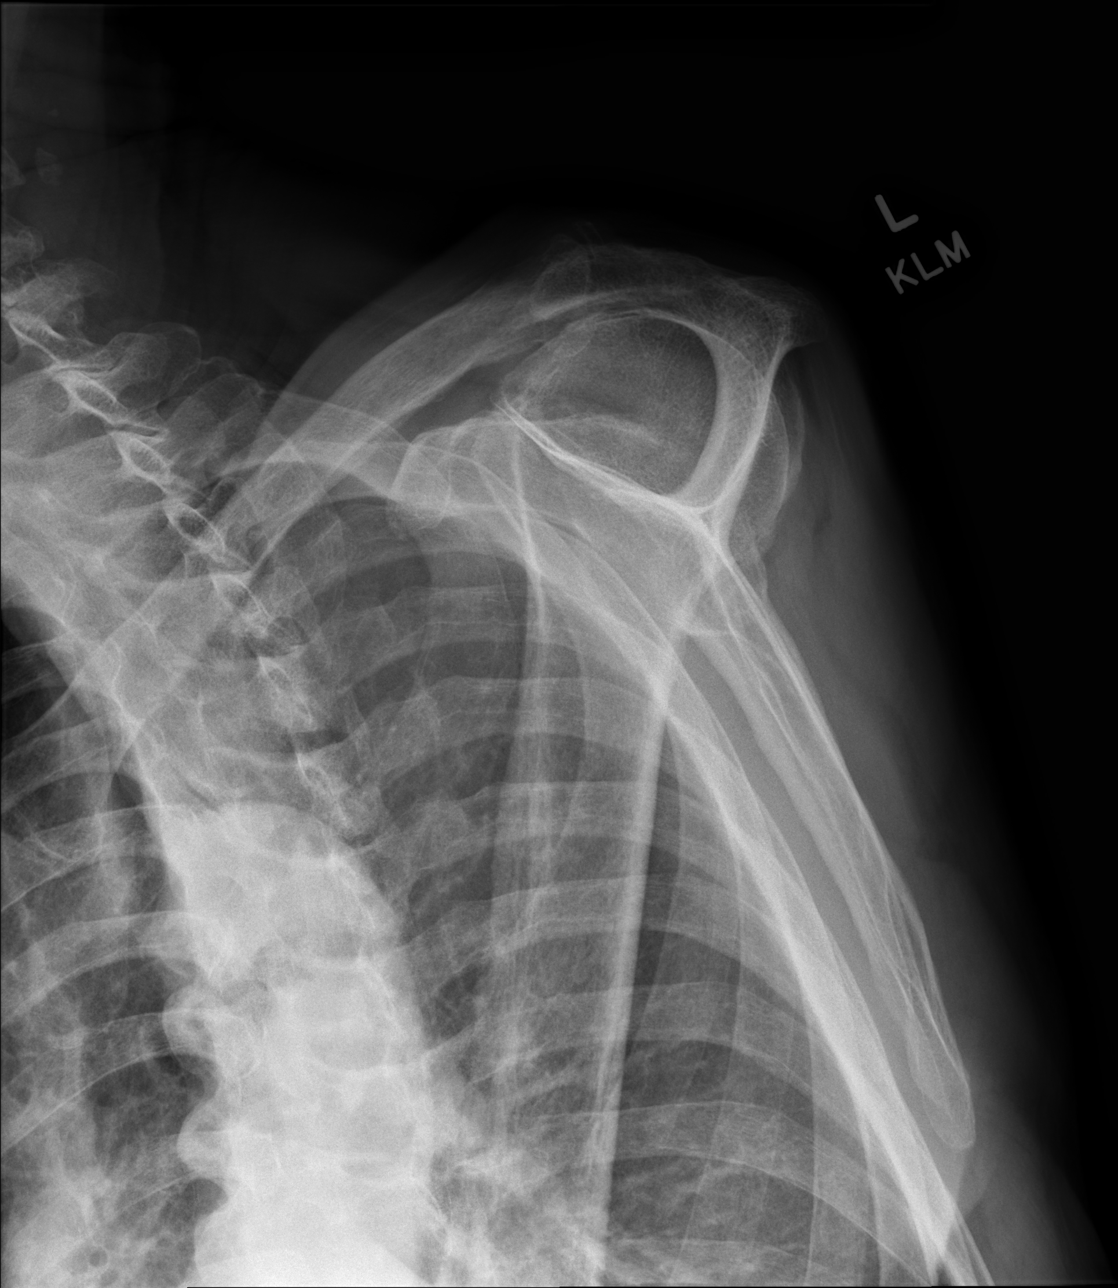

[x shoulder axillary left]
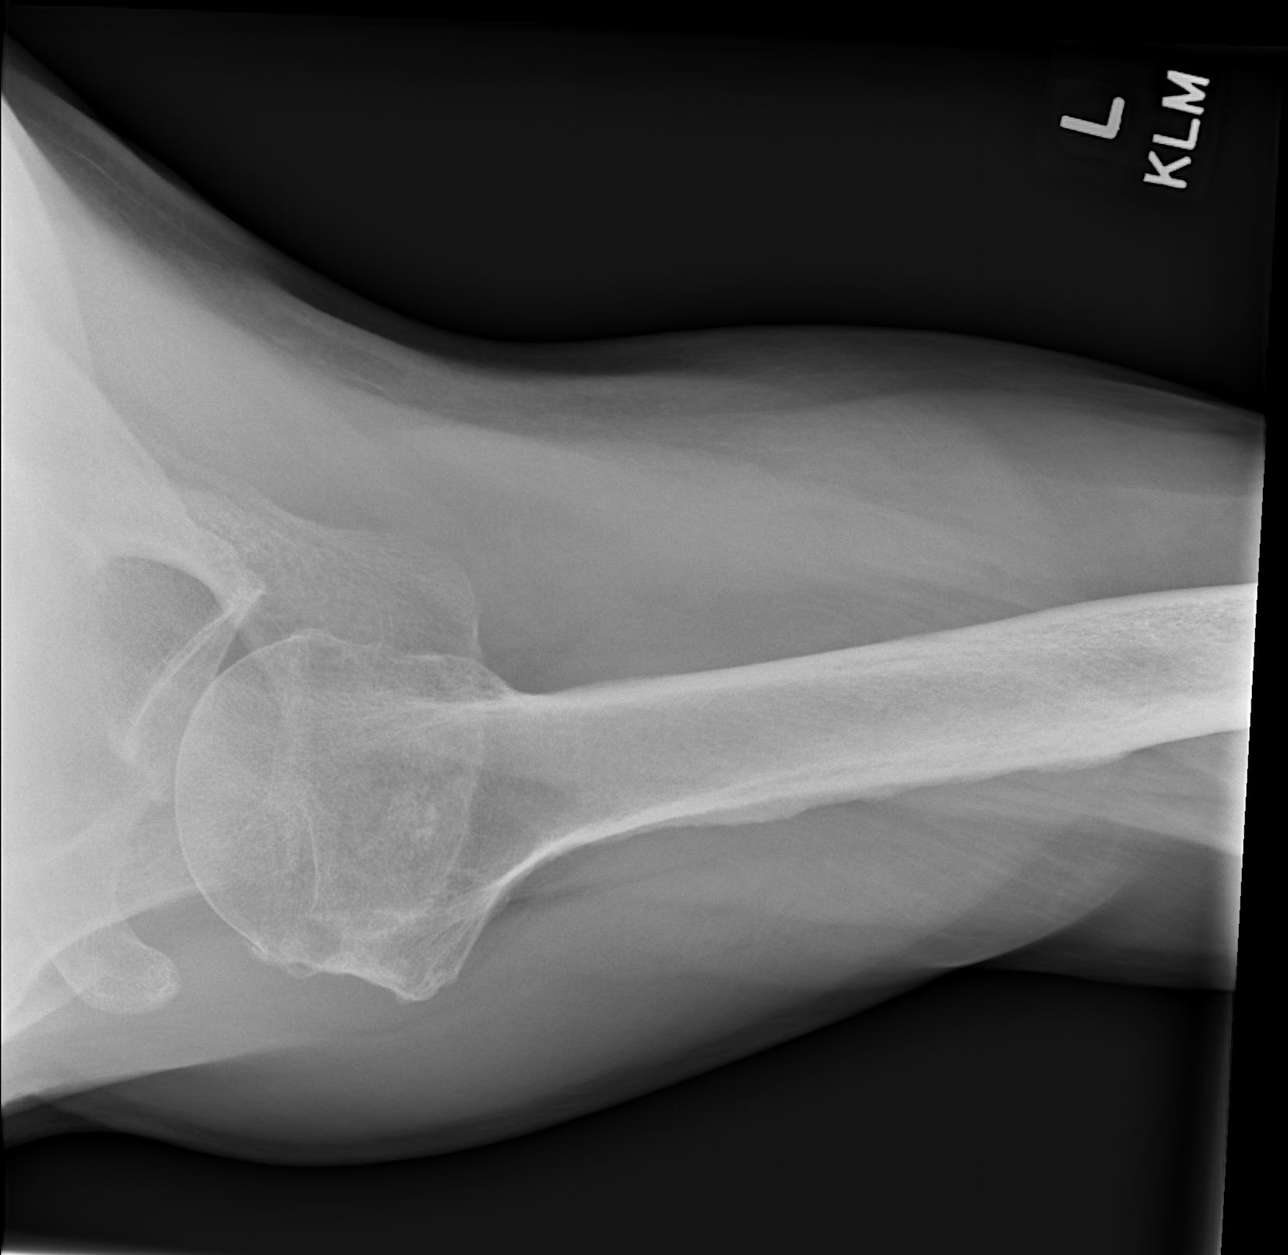

[3 of 3 positions shown; findings below may reference images not displayed]

FINDINGS: The glenohumeral joint space is maintained. Minimal inferior glenoid
degenerative osteophytosis. Mild acromioclavicular joint space
narrowing with mild-to-moderate peripheral degenerative osteophytes.
No acute fracture is seen. No dislocation. Mild distal lateral
subacromial spurring. The visualized portion of the left lung is
unremarkable.
IMPRESSION: Mild-to-moderate left acromioclavicular osteoarthritis.

## 2022-10-27 DIAGNOSIS — M545 Low back pain, unspecified: Secondary | ICD-10-CM | POA: Diagnosis not present

## 2022-10-27 DIAGNOSIS — I10 Essential (primary) hypertension: Secondary | ICD-10-CM | POA: Diagnosis not present

## 2022-11-21 ENCOUNTER — Ambulatory Visit: Payer: Medicare Other | Admitting: Physical Medicine & Rehabilitation

## 2022-11-22 ENCOUNTER — Telehealth: Payer: Self-pay | Admitting: Physical Medicine & Rehabilitation

## 2022-11-22 NOTE — Telephone Encounter (Signed)
Patient called requesting advice on what to do , states the knee nerve block did not stop pain completely and is now having more pain than before .

## 2022-11-25 NOTE — Telephone Encounter (Signed)
[  9:38 AM] Ward, Cordelia Pen LVM for [facility] to call and sch follow up with [facility] for knee pain

## 2022-11-28 ENCOUNTER — Telehealth: Payer: Self-pay | Admitting: *Deleted

## 2022-11-28 NOTE — Telephone Encounter (Signed)
Mrs Cregg called because her husband's knee is painful and swollen. He had the genicular RF on 10/11/22. She denies that he has any fever. I suggested applying ice and she reports that he is doing that. I informed her that Dr Wynn Banker is out of the office until Monday and I can send him the message but he will not respond before that time. If she/he feels it is getting worse he should be seen by PCP/Urgent Care/ED. She says that an NP looked at it and felt it had fluid in it but did not want to do anything deferring to Dr Wynn Banker. I told her it would be doubtful at this point that it is related to the actual procedure since it was over a month ago, but I will certainly rely the message to Dr Wynn Banker. She would like to know something Monday when he returns. They have mad an appointment with Dr Wynn Banker on Friday 12/06/22.

## 2022-12-05 ENCOUNTER — Encounter: Payer: Medicare Other | Attending: Physical Medicine & Rehabilitation | Admitting: Physical Medicine & Rehabilitation

## 2022-12-05 ENCOUNTER — Encounter: Payer: Self-pay | Admitting: Physical Medicine & Rehabilitation

## 2022-12-05 VITALS — BP 155/72 | HR 69 | Ht 67.0 in | Wt 174.0 lb

## 2022-12-05 DIAGNOSIS — M1711 Unilateral primary osteoarthritis, right knee: Secondary | ICD-10-CM | POA: Insufficient documentation

## 2022-12-05 MED ORDER — LIDOCAINE HCL 1 % IJ SOLN: 5.0000 mL | Freq: Once | INTRAMUSCULAR | Status: DC

## 2022-12-05 NOTE — Progress Notes (Signed)
Knee arthrocentesis Right with ultrasound guidance  Indication:RIght Knee pain and effusion not relieved by medication management and other conservative care.  Pt states swelling started ~6 wks after Genicular RF, no falls or trauma to area no redness.  Informed consent was obtained after describing risks and benefits of the procedure with the patient, this includes bleeding, bruising, infection and medication side effects. The patient wishes to proceed and has given written consent. The patient was placed in a recumbent position. The medial and lateral supracondylar aspect of the knee was marked and prepped with Betadine and alcohol after scanning area with 15Hz  linear transducer. It was then entered with a 25-gauge 1-1/2 inch needle and 1 mL of 1% lidocaine was injected into the skin and subcutaneous tissue. Then another 21 g 2"needle was inserted into the knee joint under long axis [place] scanning. Clear yellow synovial fluid was aspirated from the medial suprapatellar fluid collection x 90ml.  The same procedure and technique using with lateral supracondylar area yielded an additional 10ml of blood tinged clear synovial fluid The patient tolerated the procedure well. Post procedure instructions were given.   Discussed that repeat genicular nerve block would have no lasting effect and rec f/u with Dr Hayden Rasmussen , [facility]

## 2022-12-06 ENCOUNTER — Ambulatory Visit: Payer: Medicare Other | Admitting: Physical Medicine & Rehabilitation

## 2022-12-09 ENCOUNTER — Telehealth: Payer: Self-pay | Admitting: Physical Medicine & Rehabilitation

## 2022-12-09 NOTE — Telephone Encounter (Signed)
Patient's wife called and states they went to [facility] and they did not want to see patient and advised them to come back to our office. Patients knee is worse than last week and does not know what needs to be done to help

## 2022-12-09 NOTE — Telephone Encounter (Signed)
Patient wife is stating that his knee is still swollen and getting worse and it's now localized above the knee and down his leg to his ankle. He also said increasing the pain medication is okay with patient but wants to know what can be done about the swelling, and if he can come in and be seen.

## 2022-12-12 ENCOUNTER — Telehealth: Payer: Self-pay | Admitting: Neurology

## 2022-12-12 DIAGNOSIS — M1711 Unilateral primary osteoarthritis, right knee: Secondary | ICD-10-CM | POA: Diagnosis not present

## 2022-12-12 NOTE — Telephone Encounter (Signed)
Noted, thank you

## 2022-12-12 NOTE — Telephone Encounter (Signed)
Pt wife called. Stated she wanted to let Dr. Frances Furbish know that pt is having a knee replacement.

## 2022-12-13 DIAGNOSIS — R42 Dizziness and giddiness: Secondary | ICD-10-CM | POA: Diagnosis not present

## 2022-12-13 DIAGNOSIS — Z13220 Encounter for screening for lipoid disorders: Secondary | ICD-10-CM | POA: Diagnosis not present

## 2022-12-13 DIAGNOSIS — N183 Chronic kidney disease, stage 3 unspecified: Secondary | ICD-10-CM | POA: Diagnosis not present

## 2022-12-13 DIAGNOSIS — M797 Fibromyalgia: Secondary | ICD-10-CM | POA: Diagnosis not present

## 2022-12-13 DIAGNOSIS — R739 Hyperglycemia, unspecified: Secondary | ICD-10-CM | POA: Diagnosis not present

## 2022-12-17 DIAGNOSIS — R634 Abnormal weight loss: Secondary | ICD-10-CM | POA: Diagnosis not present

## 2022-12-17 DIAGNOSIS — F5221 Male erectile disorder: Secondary | ICD-10-CM | POA: Diagnosis not present

## 2022-12-17 DIAGNOSIS — I10 Essential (primary) hypertension: Secondary | ICD-10-CM | POA: Diagnosis not present

## 2022-12-17 DIAGNOSIS — N401 Enlarged prostate with lower urinary tract symptoms: Secondary | ICD-10-CM | POA: Diagnosis not present

## 2022-12-17 DIAGNOSIS — Z6825 Body mass index (BMI) 25.0-25.9, adult: Secondary | ICD-10-CM | POA: Diagnosis not present

## 2022-12-17 DIAGNOSIS — M1711 Unilateral primary osteoarthritis, right knee: Secondary | ICD-10-CM | POA: Diagnosis not present

## 2022-12-17 DIAGNOSIS — G629 Polyneuropathy, unspecified: Secondary | ICD-10-CM | POA: Diagnosis not present

## 2022-12-17 DIAGNOSIS — K219 Gastro-esophageal reflux disease without esophagitis: Secondary | ICD-10-CM | POA: Diagnosis not present

## 2022-12-17 DIAGNOSIS — M40295 Other kyphosis, thoracolumbar region: Secondary | ICD-10-CM | POA: Diagnosis not present

## 2022-12-17 DIAGNOSIS — M4800 Spinal stenosis, site unspecified: Secondary | ICD-10-CM | POA: Diagnosis not present

## 2022-12-18 ENCOUNTER — Telehealth: Payer: Self-pay | Admitting: Cardiology

## 2022-12-18 NOTE — Telephone Encounter (Signed)
   Name: Gerald Knapp  DOB: 03-08-1931  MRN: [id number]  Primary Cardiologist: Dina Rich, MD   Preoperative team, please contact this patient and set up a phone call appointment on or after 02/11/2023 for further preoperative risk assessment. Please obtain consent and complete medication review. Thank you for your help.  I confirm that guidance regarding antiplatelet and oral anticoagulation therapy has been completed and, if necessary, noted below (none requested).    Joylene Grapes, NP 12/18/2022, 3:30 PM [facility]

## 2022-12-18 NOTE — Telephone Encounter (Signed)
Pt was scheduled 03/10/23 at 9am. He states that he having a lot of issues walking with his knee. He has asked for his surgery to be bumped up. I did let him know if that happens to call us back and we can move his preop appt.

## 2022-12-18 NOTE — Telephone Encounter (Signed)
   Pre-operative Risk Assessment    Patient Name: Gerald Knapp  DOB: [birth year] MRN: [id number]      Request for Surgical Clearance    Procedure:   RT TOTAL KNEE ARTHROPLASTY  Date of Surgery:  Clearance 04/14/23                                 Surgeon:  DR. Ollen Gross Surgeon's Group or Practice Name:  [facility] Phone number:  [phone] Fax number:  [phone]   Type of Clearance Requested:   - Medical    Type of Anesthesia:   CHOICE   Additional requests/questions:  Please advise surgeon/provider what medications should be held.  Signed, Vallarie Mare   12/18/2022, 2:01 PM

## 2022-12-25 NOTE — Telephone Encounter (Signed)
Received clearance request from [facility] for the right TKA under choice anesthesia. Surgery scheduled for 04/14/23 with Dr Despina Hick. Dr Frances Furbish signed clearance form indicating no contraindication from neuro standpoint. Last office visit has been attached to clearance form as well and they have been faxed back to [facility] at attn of [employer]. Received a receipt of confirmation.

## 2022-12-27 DIAGNOSIS — M1711 Unilateral primary osteoarthritis, right knee: Secondary | ICD-10-CM | POA: Diagnosis not present

## 2023-01-17 DIAGNOSIS — M1711 Unilateral primary osteoarthritis, right knee: Secondary | ICD-10-CM | POA: Diagnosis not present

## 2023-01-27 NOTE — Patient Instructions (Signed)
DUE TO COVID-19 ONLY TWO VISITORS  (aged 87 and older)  ARE ALLOWED TO COME WITH YOU AND STAY IN THE WAITING ROOM ONLY DURING PRE OP AND PROCEDURE.   **NO VISITORS ARE ALLOWED IN THE SHORT STAY AREA OR RECOVERY ROOM!!**  IF YOU WILL BE ADMITTED INTO THE HOSPITAL YOU ARE ALLOWED ONLY FOUR SUPPORT PEOPLE DURING VISITATION HOURS ONLY (7 AM -8PM)   The support person(s) must pass our screening, gel in and out, and wear a mask at all times, including in the patient's room. Patients must also wear a mask when staff or their support person are in the room. Visitors GUEST BADGE MUST BE WORN VISIBLY  One adult visitor may remain with you overnight and MUST be in the room by 8 P.M.     Your procedure is scheduled on: 02/03/23   Report to [facility] Main Entrance    Report to admitting at : 10:00 AM   Call this number if you have problems the morning of surgery [phone]   Do not eat food :After Midnight.   After Midnight you may have the following liquids until : 9:30 AM DAY OF SURGERY  Water Black Coffee (sugar ok, NO MILK/CREAM OR CREAMERS)  Tea (sugar ok, NO MILK/CREAM OR CREAMERS) regular and decaf                             Plain Jell-O (NO RED)                                           Fruit ices (not with fruit pulp, NO RED)                                     Popsicles (NO RED)                                                                  Juice: apple, WHITE grape, WHITE cranberry Sports drinks like Gatorade (NO RED)   The day of surgery:  Drink ONE (1) Pre-Surgery Clear Ensure at : 9:30 AM the morning of surgery. Drink in one sitting. Do not sip.  This drink was given to you during your hospital  pre-op appointment visit. Nothing else to drink after completing the  Pre-Surgery Clear Ensure or G2.          If you have questions, please contact your surgeon's office.  Oral Hygiene is also important to reduce your risk of infection.                                     Remember - BRUSH YOUR TEETH THE MORNING OF SURGERY WITH YOUR REGULAR TOOTHPASTE  DENTURES WILL BE REMOVED PRIOR TO SURGERY PLEASE DO NOT APPLY "Poly grip" OR ADHESIVES!!!   Do NOT smoke after Midnight   Take these medicines the morning of surgery with A SIP OF WATER: metoprolol,loratadine,carbidopa.Tylenol as needed.  DO NOT TAKE ANY ORAL  DIABETIC MEDICATIONS DAY OF YOUR SURGERY                              You may not have any metal on your body including hair pins, jewelry, and body piercing             Do not wear  lotions, powders, perfumes/cologne, or deodorant              Men may shave face and neck.   Do not bring valuables to the hospital. [facility] IS NOT             RESPONSIBLE   FOR VALUABLES.   Contacts, glasses, or bridgework may not be worn into surgery.   Bring small overnight bag day of surgery.   DO NOT BRING YOUR HOME MEDICATIONS TO THE HOSPITAL. PHARMACY WILL DISPENSE MEDICATIONS LISTED ON YOUR MEDICATION LIST TO YOU DURING YOUR ADMISSION IN THE HOSPITAL!    Patients discharged on the day of surgery will not be allowed to drive home.  Someone NEEDS to stay with you for the first 24 hours after anesthesia.   Special Instructions: Bring a copy of your healthcare power of attorney and living will documents         the day of surgery if you haven't scanned them before.              Please read over the following fact sheets you were given: IF YOU HAVE QUESTIONS ABOUT YOUR PRE-OP INSTRUCTIONS PLEASE CALL [phone]      Pre-operative 5 CHG Bath Instructions   You can play a key role in reducing the risk of infection after surgery. Your skin needs to be as free of germs as possible. You can reduce the number of germs on your skin by washing with CHG (chlorhexidine gluconate) soap before surgery. CHG is an antiseptic soap that kills germs and continues to kill germs even after washing.   DO NOT use if you have an allergy to chlorhexidine/CHG or antibacterial  soaps. If your skin becomes reddened or irritated, stop using the CHG and notify one of our RNs at : [phone].   Please shower with the CHG soap starting 4 days before surgery using the following schedule:     Please keep in mind the following:  DO NOT shave, including legs and underarms, starting the day of your first shower.   You may shave your face at any point before/day of surgery.  Place clean sheets on your bed the day you start using CHG soap. Use a clean washcloth (not used since being washed) for each shower. DO NOT sleep with pets once you start using the CHG.   CHG Shower Instructions:  If you choose to wash your hair and private area, wash first with your normal shampoo/soap.  After you use shampoo/soap, rinse your hair and body thoroughly to remove shampoo/soap residue.  Turn the water OFF and apply about 3 tablespoons (45 ml) of CHG soap to a CLEAN washcloth.  Apply CHG soap ONLY FROM YOUR NECK DOWN TO YOUR TOES (washing for 3-5 minutes)  DO NOT use CHG soap on face, private areas, open wounds, or sores.  Pay special attention to the area where your surgery is being performed.  If you are having back surgery, having someone wash your back for you may be helpful. Wait 2 minutes after CHG soap is applied, then you may rinse  off the CHG soap.  Pat dry with a clean towel  Put on clean clothes/pajamas   If you choose to wear lotion, please use ONLY the CHG-compatible lotions on the back of this paper.     Additional instructions for the day of surgery: DO NOT APPLY any lotions, deodorants, cologne, or perfumes.   Put on clean/comfortable clothes.  Brush your teeth.  Ask your nurse before applying any prescription medications to the skin.      CHG Compatible Lotions   Aveeno Moisturizing lotion  Cetaphil Moisturizing Cream  Cetaphil Moisturizing Lotion  Clairol Herbal Essence Moisturizing Lotion, Dry Skin  Clairol Herbal Essence Moisturizing Lotion, Extra Dry  Skin  Clairol Herbal Essence Moisturizing Lotion, Normal Skin  Curel Age Defying Therapeutic Moisturizing Lotion with Alpha Hydroxy  Curel Extreme Care Body Lotion  Curel Soothing Hands Moisturizing Hand Lotion  Curel Therapeutic Moisturizing Cream, Fragrance-Free  Curel Therapeutic Moisturizing Lotion, Fragrance-Free  Curel Therapeutic Moisturizing Lotion, Original Formula  Eucerin Daily Replenishing Lotion  Eucerin Dry Skin Therapy Plus Alpha Hydroxy Crme  Eucerin Dry Skin Therapy Plus Alpha Hydroxy Lotion  Eucerin Original Crme  Eucerin Original Lotion  Eucerin Plus Crme Eucerin Plus Lotion  Eucerin TriLipid Replenishing Lotion  Keri Anti-Bacterial Hand Lotion  Keri Deep Conditioning Original Lotion Dry Skin Formula Softly Scented  Keri Deep Conditioning Original Lotion, Fragrance Free Sensitive Skin Formula  Keri Lotion Fast Absorbing Fragrance Free Sensitive Skin Formula  Keri Lotion Fast Absorbing Softly Scented Dry Skin Formula  Keri Original Lotion  Keri Skin Renewal Lotion Keri Silky Smooth Lotion  Keri Silky Smooth Sensitive Skin Lotion  Nivea Body Creamy Conditioning Oil  Nivea Body Extra Enriched Lotion  Nivea Body Original Lotion  Nivea Body Sheer Moisturizing Lotion Nivea Crme  Nivea Skin Firming Lotion  NutraDerm 30 Skin Lotion  NutraDerm Skin Lotion  NutraDerm Therapeutic Skin Cream  NutraDerm Therapeutic Skin Lotion  ProShield Protective Hand Cream  Provon moisturizing lotion   Incentive Spirometer  An incentive spirometer is a tool that can help keep your lungs clear and active. This tool measures how well you are filling your lungs with each breath. Taking long deep breaths may help reverse or decrease the chance of developing breathing (pulmonary) problems (especially infection) following: A long period of time when you are unable to move or be active. BEFORE THE PROCEDURE  If the spirometer includes an indicator to show your best effort, your nurse or  respiratory therapist will set it to a desired goal. If possible, sit up straight or lean slightly forward. Try not to slouch. Hold the incentive spirometer in an upright position. INSTRUCTIONS FOR USE  Sit on the edge of your bed if possible, or sit up as far as you can in bed or on a chair. Hold the incentive spirometer in an upright position. Breathe out normally. Place the mouthpiece in your mouth and seal your lips tightly around it. Breathe in slowly and as deeply as possible, raising the piston or the ball toward the top of the column. Hold your breath for 3-5 seconds or for as long as possible. Allow the piston or ball to fall to the bottom of the column. Remove the mouthpiece from your mouth and breathe out normally. Rest for a few seconds and repeat Steps 1 through 7 at least 10 times every 1-2 hours when you are awake. Take your time and take a few normal breaths between deep breaths. The spirometer may include an indicator to show your best  effort. Use the indicator as a goal to work toward during each repetition. After each set of 10 deep breaths, practice coughing to be sure your lungs are clear. If you have an incision (the cut made at the time of surgery), support your incision when coughing by placing a pillow or rolled up towels firmly against it. Once you are able to get out of bed, walk around indoors and cough well. You may stop using the incentive spirometer when instructed by your caregiver.  RISKS AND COMPLICATIONS Take your time so you do not get dizzy or light-headed. If you are in pain, you may need to take or ask for pain medication before doing incentive spirometry. It is harder to take a deep breath if you are having pain. AFTER USE Rest and breathe slowly and easily. It can be helpful to keep track of a log of your progress. Your caregiver can provide you with a simple table to help with this. If you are using the spirometer at home, follow these  instructions: SEEK MEDICAL CARE IF:  You are having difficultly using the spirometer. You have trouble using the spirometer as often as instructed. Your pain medication is not giving enough relief while using the spirometer. You develop fever of 100.5 F (38.1 C) or higher. SEEK IMMEDIATE MEDICAL CARE IF:  You cough up bloody sputum that had not been present before. You develop fever of 102 F (38.9 C) or greater. You develop worsening pain at or near the incision site. MAKE SURE YOU:  Understand these instructions. Will watch your condition. Will get help right away if you are not doing well or get worse. Document Released: 11/25/2006 Document Revised: 10/07/2011 Document Reviewed: 01/26/2007 [facility] Patient Information 2014 [place], [place].   ________________________________________________________________________

## 2023-01-28 ENCOUNTER — Telehealth: Payer: Self-pay | Admitting: *Deleted

## 2023-01-28 ENCOUNTER — Other Ambulatory Visit: Payer: Self-pay

## 2023-01-28 ENCOUNTER — Encounter (HOSPITAL_COMMUNITY): Payer: Self-pay

## 2023-01-28 ENCOUNTER — Encounter (HOSPITAL_COMMUNITY)
Admission: RE | Admit: 2023-01-28 | Discharge: 2023-01-28 | Disposition: A | Discharge status: Home / Self Care | Payer: Medicare Other | Source: Ambulatory Visit | Attending: Orthopedic Surgery | Admitting: Orthopedic Surgery

## 2023-01-28 VITALS — BP 145/70 | HR 64 | Temp 97.9°F | Ht 65.0 in

## 2023-01-28 DIAGNOSIS — Z01818 Encounter for other preprocedural examination: Secondary | ICD-10-CM

## 2023-01-28 DIAGNOSIS — K219 Gastro-esophageal reflux disease without esophagitis: Secondary | ICD-10-CM | POA: Insufficient documentation

## 2023-01-28 DIAGNOSIS — I129 Hypertensive chronic kidney disease with stage 1 through stage 4 chronic kidney disease, or unspecified chronic kidney disease: Secondary | ICD-10-CM | POA: Diagnosis not present

## 2023-01-28 DIAGNOSIS — I2510 Atherosclerotic heart disease of native coronary artery without angina pectoris: Secondary | ICD-10-CM | POA: Diagnosis not present

## 2023-01-28 DIAGNOSIS — N189 Chronic kidney disease, unspecified: Secondary | ICD-10-CM | POA: Diagnosis not present

## 2023-01-28 DIAGNOSIS — M1711 Unilateral primary osteoarthritis, right knee: Secondary | ICD-10-CM | POA: Insufficient documentation

## 2023-01-28 DIAGNOSIS — Z01812 Encounter for preprocedural laboratory examination: Secondary | ICD-10-CM | POA: Insufficient documentation

## 2023-01-28 HISTORY — DX: Unspecified osteoarthritis, unspecified site: M19.90

## 2023-01-28 HISTORY — DX: Tachycardia, unspecified: R00.0

## 2023-01-28 LAB — BASIC METABOLIC PANEL
Anion gap: 9 (ref 5–15)
BUN: 24 mg/dL — ABNORMAL HIGH (ref 8–23)
CO2: 26 mmol/L (ref 22–32)
Calcium: 9 mg/dL (ref 8.9–10.3)
Chloride: 103 mmol/L (ref 98–111)
Creatinine, Ser: 0.91 mg/dL (ref 0.61–1.24)
GFR, Estimated: >60 mL/min (ref >60)
Glucose, Bld: 135 mg/dL — ABNORMAL HIGH (ref 70–99)
Potassium: 3.8 mmol/L (ref 3.5–5.1)
Sodium: 138 mmol/L (ref 135–145)

## 2023-01-28 LAB — CBC
HCT: 34.8 % — ABNORMAL LOW (ref 39.0–52.0)
Hemoglobin: 11.1 g/dL — ABNORMAL LOW (ref 13.0–17.0)
MCH: 30.9 pg (ref 26.0–34.0)
MCHC: 31.9 g/dL (ref 30.0–36.0)
MCV: 96.9 fL (ref 80.0–100.0)
Platelets: 252 10*3/uL (ref 150–400)
RBC: 3.59 MIL/uL — ABNORMAL LOW (ref 4.22–5.81)
RDW: 12.9 % (ref 11.5–15.5)
WBC: 6.5 10*3/uL (ref 4.0–10.5)
nRBC: 0 % (ref 0.0–0.2)

## 2023-01-28 LAB — SURGICAL PCR SCREEN
MRSA, PCR: NEGATIVE
Staphylococcus aureus: NEGATIVE

## 2023-01-28 NOTE — Telephone Encounter (Signed)
Surgery date has been changed to 02/03/2023. Please contact for sooner appointment with Pre-op team.

## 2023-01-28 NOTE — Telephone Encounter (Signed)
Pt's wife called out office asking why had we not sent out our clearance notes. I s/w the pt's wife and I explained to her that I only just found out about 1 hour ago the surgery date was moved up to 02/03/23. Surgery scheduler tells me that she sent the updated request to the [place] office last week. I informed the pt's wife that may explain why I did not know the date of surgery was changed as I am not in the same location. I was able to work pt into the pre op schedule for 01/29/23 @ 3 pm as add on now due to procedure date 02/23/23. Pt's wife tells me that the pt took ASA on 01/27/23 and has been advised by surgeon's office to hold as of 01/28/23 until post surgery.   Pt and his wife are very grateful for my help in this matter. Pre op APP will fax notes after tele appt once the pt has been cleared.

## 2023-01-28 NOTE — Progress Notes (Addendum)
For Short Stay: COVID SWAB appointment date:  Bowel Prep reminder:   For Anesthesia: PCP - Dr. Fara Chute. Cardiologist - Dr. Dina Rich. LOV: 09/19/22. Clearance: pending.  Chest x-ray -  EKG - 09/19/22 Stress Test -  ECHO -  Cardiac Cath - Done,date unknown. Pacemaker/ICD device last checked: Pacemaker orders received: Device Rep notified:  Spinal Cord Stimulator: N/A  Sleep Study - N/A CPAP -   Fasting Blood Sugar - N/A Checks Blood Sugar _____ times a day Date and result of last Hgb A1c-  Last dose of GLP1 agonist- N/A GLP1 instructions:   Last dose of SGLT-2 inhibitors- N/A SGLT-2 instructions:   Blood Thinner Instructions: Aspirin Instructions: On hold since: 01/27/23 Last Dose:  Activity level: Can go up a flight of stairs and activities of daily living without stopping and without chest pain and/or shortness of breath   Able to exercise without chest pain and/or shortness of breath  Anesthesia review: Hx: HTN,CKD,Tachycardia,chest pain  Patient denies shortness of breath, fever, cough and chest pain at PAT appointment   Patient verbalized understanding of instructions that were given to them at the PAT appointment. Patient was also instructed that they will need to review over the PAT instructions again at home before surgery.

## 2023-01-28 NOTE — H&P (Signed)
TOTAL KNEE ADMISSION H&P  Patient is being admitted for right total knee arthroplasty.  Subjective:  Chief Complaint: Right knee pain.  HPI: Gerald Knapp, 89 y.o. male has a history of pain and functional disability in the right knee due to arthritis and has failed non-surgical conservative treatments for greater than 12 weeks to include NSAID's and/or analgesics, corticosteriod injections, use of assistive devices, and activity modification. Onset of symptoms was gradual, starting >10 years ago with rapidlly worsening course since that time. The patient noted no past surgery on the right knee.  Patient currently rates pain in the right knee at 9 out of 10 with activity. Patient has night pain, worsening of pain with activity and weight bearing, pain that interferes with activities of daily living, pain with passive range of motion, crepitus, and joint swelling. Patient has evidence of periarticular osteophytes, joint subluxation, and joint space narrowing by imaging studies. There is no active infection.  Patient Active Problem List   Diagnosis Date Noted   Osteoarthritis of right knee 09/19/2022    Past Medical History:  Diagnosis Date   Allergic rhinitis    Benign prostate hyperplasia    Chronic kidney disease    GERD (gastroesophageal reflux disease)    Hypertension    Tremor     Past Surgical History:  Procedure Laterality Date   CARDIAC CATHETERIZATION     CATARACT EXTRACTION     COLONOSCOPY     HERNIA REPAIR     NOSE SURGERY      Prior to Admission medications   Medication Sig Start Date End Date Taking? Authorizing Provider  acetaminophen (TYLENOL) 650 MG CR tablet Take 650 mg by mouth 2 (two) times daily as needed for pain.   Yes [provider]  aspirin 81 MG chewable tablet Chew 81 mg by mouth daily.   Yes [provider]  carbidopa-levodopa (SINEMET CR) 50-200 MG tablet Take 1 tablet by mouth daily. Patient taking differently: Take 1 tablet by  mouth at bedtime. 09/11/22  Yes Huston Foley, MD  carbidopa-levodopa (SINEMET IR) 25-100 MG tablet Take 1 tablet by mouth 4 (four) times daily. Take at 8 AM, 12, 4 PM and 8 PM daily. 09/16/22  Yes Huston Foley, MD  famotidine-calcium carbonate-magnesium hydroxide (PEPCID COMPLETE) 10-800-165 MG chewable tablet Chew 1 tablet by mouth daily as needed (heartburn).   Yes [provider]  fluticasone (FLONASE) 50 MCG/ACT nasal spray Place 1 spray into both nostrils daily. 09/08/21  Yes [provider]  loratadine (CLARITIN) 10 MG tablet Take 10 mg by mouth daily.   Yes [provider]  metoprolol tartrate (LOPRESSOR) 25 MG tablet TAKE 1/2 TABLET TWICE DAILY 09/30/22  Yes Branch, Dorothe Pea, MD  polyvinyl alcohol (LIQUIFILM TEARS) 1.4 % ophthalmic solution Place 1 drop into both eyes daily as needed for dry eyes.   Yes [provider]    No Known Allergies  Social History   Socioeconomic History   Marital status: Married    Spouse name: Lilly    Number of children: Not on file   Years of education: Not on file   Highest education level: Not on file  Occupational History   Not on file  Tobacco Use   Smoking status: Never   Smokeless tobacco: Never  Vaping Use   Vaping Use: Never used  Substance and Sexual Activity   Alcohol use: Yes    Comment: rarely a drink   Drug use: Never   Sexual activity: Not on  file  Other Topics Concern   Not on file  Social History Narrative   [place]. Daughter and son-in-law live [place] the same farm in a separate house   Right handed   Social Determinants of Health   Financial Resource Strain: Not on file  Food Insecurity: Not on file  Transportation Needs: Not on file  Physical Activity: Not on file  Stress: Not on file  Social Connections: Not on file  Intimate Partner Violence: Not on file    Tobacco Use: Low Risk  (12/05/2022)   Patient History    Smoking Tobacco Use: Never    Smokeless Tobacco  Use: Never    Passive Exposure: Not on file   Social History   Substance and Sexual Activity  Alcohol Use Yes   Comment: rarely a drink    Family History  Problem Relation Age of Onset   Arthritis Mother    Hypertension Mother    Cancer Father    Diabetes Father    Arthritis Father     Review of Systems  Constitutional:  Negative for chills and fever.  HENT:  Negative for congestion, sore throat and tinnitus.   Eyes:  Negative for double vision, photophobia and pain.  Respiratory:  Negative for cough, shortness of breath and wheezing.   Cardiovascular:  Negative for chest pain, palpitations and orthopnea.  Gastrointestinal:  Negative for heartburn, nausea and vomiting.  Genitourinary:  Negative for dysuria, frequency and urgency.  Musculoskeletal:  Positive for joint pain.  Neurological:  Negative for dizziness, weakness and headaches.    Objective:  Physical Exam:  Well nourished and well developed.  General: Alert and oriented x3, cooperative and pleasant, no acute distress.  Head: normocephalic, atraumatic, neck supple.  Eyes: EOMI.  Musculoskeletal:  Right Knee Exam: - Patient is sitting in a wheelchair due to discomfort in the knee. - Massive effusion in the right knee. - No warmth about the knee. - Significant varus deformity. - Range of motion is approximately 10-120. - No instability about the knee. - Tender medially with no lateral tenderness.  Calves soft and nontender. Motor function intact in LE. Strength 5/5 LE bilaterally. Neuro: Distal pulses 2+. Sensation to light touch intact in LE.   Imaging Review Plain radiographs demonstrate severe degenerative joint disease of the right knee. The overall alignment is significant varus. The bone quality appears to be adequate for age and reported activity level.  Assessment/Plan:  End stage arthritis, right knee   The patient history, physical examination, clinical judgment of the provider and imaging  studies are consistent with end stage degenerative joint disease of the right knee and total knee arthroplasty is deemed medically necessary. The treatment options including medical management, injection therapy arthroscopy and arthroplasty were discussed at length. The risks and benefits of total knee arthroplasty were presented and reviewed. The risks due to aseptic loosening, infection, stiffness, patella tracking problems, thromboembolic complications and other imponderables were discussed. The patient acknowledged the explanation, agreed to proceed with the plan and consent was signed. Patient is being admitted for inpatient treatment for surgery, pain control, PT, OT, prophylactic antibiotics, VTE prophylaxis, progressive ambulation and ADLs and discharge planning. The patient is planning to be discharged  home .   Patient's anticipated LOS is less than 2 midnights, meeting these requirements: - Lives within 1 hour of care - Has a competent adult at home to recover with post-op recover - NO history of  - Chronic pain requiring  opiods  - Diabetes  - Coronary Artery Disease  - Heart failure  - Heart attack  - Stroke  - DVT/VTE  - Cardiac arrhythmia  - Respiratory Failure/COPD  - Renal failure  - Anemia  - Advanced Liver disease   Therapy Plans: Outpatient therapy at [facility] Disposition: Home with family Planned DVT Prophylaxis: Aspirin 81 mg BID DME Needed: None PCP: Fara Chute, MD (clearance received) Neurologist: Waunita Schooner, MD (clearance received) Cardiologist: Dina Rich, MD (LOV 09/19/22) TXA: IV Allergies: NKDA Metal Allergy: None Anesthesia Concerns: None BMI: 26.8 Last HgbA1c: Not diabetic.  Pain Regimen: Oxycodone + tramadol Pharmacy: [facility]  Other: - PMH: Parkinsons  - Patient was instructed on what medications to stop prior to surgery. - Follow-up visit in 2 weeks with Dr. Lequita Halt - Begin physical therapy following surgery -  Pre-operative lab work as pre-surgical testing - Prescriptions will be provided in hospital at time of discharge  Arther Abbott, PA-C Orthopedic Surgery [facility] [place]

## 2023-01-28 NOTE — Telephone Encounter (Signed)
   Name: Gerald Knapp.  DOB: [birth year]  MRN: [id number]  Primary Cardiologist: Dina Rich, MD   Preoperative team, please contact this patient and set up a phone call appointment for further preoperative risk assessment. Please obtain consent and complete medication review. Thank you for your help.  Patient's surgery date has been moved up from 02/03/2023 and original request for televisit was 02/11/2023.  Patient will need sooner virtual visit to accommodate for change in surgical date.  I confirm that guidance regarding antiplatelet and oral anticoagulation therapy has been completed and, if necessary, noted below.  None requested   Napoleon Form, Leodis Rains, NP 01/28/2023, 4:59 PM [facility]

## 2023-01-28 NOTE — Telephone Encounter (Signed)
Pt's wife called out office asking why had we not sent out our clearance notes. I s/w the pt's wife and I explained to her that I only just found out about 1 hour ago the surgery date was moved up to 02/03/23. Surgery scheduler tells me that she sent the updated request to the [place] office last week. I informed the pt's wife that may explain why I did not know the date of surgery was changed as I am not in the same location. I was able to work pt into the pre op schedule for 01/29/23 @ 3 pm as add on now due to procedure date 02/23/23. Pt's wife tells me that the pt took ASA on 01/27/23 and has been advised by surgeon's office to hold as of 01/28/23 until post surgery.   Pt and his wife are very grateful for my help in this matter. Pre op APP will fax notes after tele appt once the pt has been cleared.  

## 2023-01-29 ENCOUNTER — Encounter (HOSPITAL_COMMUNITY): Payer: Self-pay

## 2023-01-29 ENCOUNTER — Ambulatory Visit: Payer: Medicare Other | Attending: Cardiovascular Disease | Admitting: Student

## 2023-01-29 DIAGNOSIS — Z01810 Encounter for preprocedural cardiovascular examination: Secondary | ICD-10-CM

## 2023-01-29 NOTE — Progress Notes (Signed)
Virtual Visit via Telephone Note   Because of Gerald DIRENZO Jr.'s co-morbid illnesses, he is at least at moderate risk for complications without adequate follow up.  This format is felt to be most appropriate for this patient at this time.  The patient did not have access to video technology/had technical difficulties with video requiring transitioning to audio format only (telephone).  All issues noted in this document were discussed and addressed.  No physical exam could be performed with this format.  Please refer to the patient's chart for his consent to telehealth for [facility].  Evaluation Performed:  Preoperative cardiovascular risk assessment _____________   Date:  01/29/2023   Patient ID:  Gerald Knapp., DOB 07-29-1931, MRN [id number] Patient Location:  Home Provider location:   Office  Primary Care Provider:  Estanislado Pandy, MD Primary Cardiologist:  Dina Rich, MD  Chief Complaint / Patient Profile   89 y.o. y/o male with a h/o palpitations/SVT, Parkinson disease who is pending right total knee arthroplasty by Dr. Lequita Halt and presents today for telephonic preoperative cardiovascular risk assessment.  History of Present Illness    Gerald Knapp. is a 89 y.o. male who presents via audio/video conferencing for a telehealth visit today.  Pt was last seen in cardiology clinic on 09/19/2022 by Dr. Wyline Mood.  At that time [facility]. was doing well.  The patient is now pending procedure as outlined above. Since his last visit, he is doing well from a cardiac standpoint. Patient denies shortness of breath or dyspnea on exertion. No chest pain, pressure, or tightness. Denies lower extremity edema, orthopnea, or PND. No palpitations. His activity is limited by his knee pain. He is walking with a walker for stability. He is independent with ADLs and is able to do some light to moderate household chores and short, frequent walks around his home.    Past Medical History    Past Medical History:  Diagnosis Date   Allergic rhinitis    Arthritis    Benign prostate hyperplasia    Chronic kidney disease    GERD (gastroesophageal reflux disease)    Hypertension    Tachycardia    Tremor    Past Surgical History:  Procedure Laterality Date   CARDIAC CATHETERIZATION     CATARACT EXTRACTION     COLONOSCOPY     HERNIA REPAIR     NOSE SURGERY      Allergies  No Known Allergies  Home Medications    Prior to Admission medications   Medication Sig Start Date End Date Taking? Authorizing Provider  acetaminophen (TYLENOL) 650 MG CR tablet Take 650 mg by mouth 2 (two) times daily as needed for pain.    [provider]  aspirin 81 MG chewable tablet Chew 81 mg by mouth daily.    [provider]  carbidopa-levodopa (SINEMET CR) 50-200 MG tablet Take 1 tablet by mouth daily. Patient taking differently: Take 1 tablet by mouth at bedtime. 09/11/22   Huston Foley, MD  carbidopa-levodopa (SINEMET IR) 25-100 MG tablet Take 1 tablet by mouth 4 (four) times daily. Take at 8 AM, 12, 4 PM and 8 PM daily. 09/16/22   Huston Foley, MD  famotidine-calcium carbonate-magnesium hydroxide (PEPCID COMPLETE) 10-800-165 MG chewable tablet Chew 1 tablet by mouth daily as needed (heartburn).    [provider]  fluticasone (FLONASE) 50 MCG/ACT nasal spray Place 1 spray into both nostrils daily. 09/08/21   [provider]  loratadine (CLARITIN) 10 MG tablet Take 10 mg by mouth daily.    [provider]  metoprolol tartrate (LOPRESSOR) 25 MG tablet TAKE 1/2 TABLET TWICE DAILY 09/30/22   Antoine Poche, MD  polyvinyl alcohol (LIQUIFILM TEARS) 1.4 % ophthalmic solution Place 1 drop into both eyes daily as needed for dry eyes.    [provider]    Physical Exam    Vital Signs:  Gerald Knapp. does not have vital signs available for review today.  Given telephonic nature of communication, physical  exam is limited. AAOx3. NAD. Normal affect.  Speech and respirations are unlabored.  Accessory Clinical Findings    None  Assessment & Plan    Primary Cardiologist: Dina Rich, MD  Preoperative cardiovascular risk assessment. Right total knee arthroplasty with Dr. Lequita Halt.   Chart reviewed as part of pre-operative protocol coverage. According to the RCRI, patient has a 0.4% risk of MACE. Patient reports activity equivalent to 4.4 METS (per DASI).   Given past medical history and time since last visit, based on ACC/AHA guidelines, Gerald Knapp. would be at acceptable risk for the planned procedure without further cardiovascular testing.   Patient was advised that if he develops new symptoms prior to surgery to contact our office to arrange a follow-up appointment.  he verbalized understanding.   I will route this recommendation to the requesting party via Epic fax function.  Please call with questions.  Time:   Today, I have spent 5 minutes with the patient with telehealth technology discussing medical history, symptoms, and management plan.     Carlos Levering, NP  01/29/2023, 11:29 AM

## 2023-01-31 ENCOUNTER — Encounter (HOSPITAL_COMMUNITY): Payer: Self-pay

## 2023-01-31 ENCOUNTER — Ambulatory Visit: Payer: Medicare Other | Admitting: Physical Medicine & Rehabilitation

## 2023-01-31 NOTE — Anesthesia Preprocedure Evaluation (Signed)
Anesthesia Evaluation  Patient identified by MRN, date of birth, ID band Patient awake    Reviewed: Allergy & Precautions, NPO status , Patient's Chart, lab work & pertinent test results, reviewed documented beta blocker date and time   Airway Mallampati: II  TM Distance: >3 FB Neck ROM: Full    Dental  (+) Teeth Intact, Dental Advisory Given   Pulmonary neg pulmonary ROS   Pulmonary exam normal breath sounds clear to auscultation       Cardiovascular hypertension, Pt. on home beta blockers Normal cardiovascular exam Rhythm:Regular Rate:Normal     Neuro/Psych negative neurological ROS     GI/Hepatic Neg liver ROS,GERD  Medicated,,  Endo/Other  negative endocrine ROS    Renal/GU Renal InsufficiencyRenal disease     Musculoskeletal  (+) Arthritis , Osteoarthritis,    Abdominal   Peds  Hematology  (+) Blood dyscrasia, anemia Plt 252k   Anesthesia Other Findings Day of surgery medications reviewed with the patient.  Reproductive/Obstetrics                             Anesthesia Physical Anesthesia Plan  ASA: 3  Anesthesia Plan: Spinal   Post-op Pain Management: Ofirmev IV (intra-op)*   Induction: Intravenous  PONV Risk Score and Plan: 2 and TIVA, Dexamethasone and Ondansetron  Airway Management Planned: Natural Airway and Simple Face Mask  Additional Equipment:   Intra-op Plan:   Post-operative Plan:   Informed Consent: I have reviewed the patients History and Physical, chart, labs and discussed the procedure including the risks, benefits and alternatives for the proposed anesthesia with the patient or authorized representative who has indicated his/her understanding and acceptance.     Dental advisory given  Plan Discussed with: CRNA, Anesthesiologist and Surgeon  Anesthesia Plan Comments: (See PAT note from 7/8 by K Marlee Armenteros PA-C )        Anesthesia Quick  Evaluation

## 2023-01-31 NOTE — Progress Notes (Signed)
Case: [id number] Date/Time: 02/03/23 1225   Procedure: TOTAL KNEE ARTHROPLASTY (Right: Knee)   Anesthesia type: Choice   Pre-op diagnosis: right knee osteoarthritis   Location: [facility] ROOM 10 / [facility]   Surgeons: Ollen Gross, MD       DISCUSSION: Gerald Knapp is an 89 yo male who presents to PAT prior to surgery above. PMH significant for HTN, palpitations/SVT, GERD, BPH, CKD, arthritis, tremor.  No prior anesthesia complications.  Pt was last seen in cardiology clinic on 09/19/2022 by Dr. Wyline Mood.  At that time [facility]. was doing well. Since his last visit, he is doing well from a cardiac standpoint. Cardiac risk assessment and clearance provided on 01/29/23 via televisit:  "Chart reviewed as part of pre-operative protocol coverage. According to the RCRI, patient has a 0.4% risk of MACE. Patient reports activity equivalent to 4.4 METS (per DASI).    Given past medical history and time since last visit, based on ACC/AHA guidelines, Huston Dittus. would be at acceptable risk for the planned procedure without further cardiovascular testing."  VS: BP (!) 145/70   Pulse 64   Temp 36.6 C (Oral)   Ht 5\' 5"  (1.651 m)   SpO2 93%   BMI 28.96 kg/m   PROVIDERS: Primary Care Provider:  Estanislado Pandy, MD Primary Cardiologist:  Dina Rich, MD   LABS: Labs reviewed: Acceptable for surgery. (all labs ordered are listed, but only abnormal results are displayed)  Labs Reviewed  BASIC METABOLIC PANEL - Abnormal; Notable for the following components:      Result Value   Glucose, Bld 135 (*)    BUN 24 (*)    All other components within normal limits  CBC - Abnormal; Notable for the following components:   RBC 3.59 (*)    Hemoglobin 11.1 (*)    HCT 34.8 (*)    All other components within normal limits  SURGICAL PCR SCREEN     IMAGES: n/a   EKG: n/a   CV:  Holter monitor 07/10/2021:  14 day monitor Rare supraventricular ectopy in the form of isolated  PACs, couplets, triplets. Rare runs of SVT lasting up to and 30 seconds Symptoms correlated with sinus rhythm with PVCs Nocturnal bradycardia to 39 bpm     Patch Wear Time:  13 days and 17 hours (2022-11-18T16:40:48-0500 to 2022-12-02T09:52:47-0500)   Patient had a min HR of 39 bpm, max HR of 171 bpm, and avg HR of 65 bpm. Predominant underlying rhythm was Sinus Rhythm. First Degree AV Block was present. 11 Supraventricular Tachycardia runs occurred, the run with the fastest interval lasting 4 beats  with a max rate of 171 bpm, the longest lasting 2 mins 28 secs with an avg rate of 147 bpm. Idioventricular Rhythm was present. Isolated SVEs were rare (<1.0%), SVE Couplets were rare (<1.0%), and SVE Triplets were rare (<1.0%). Isolated VEs were  occasional (4.8%, 61302), VE Couplets were rare (<1.0%, 508), and VE Triplets were rare (<1.0%, 56). Ventricular Bigeminy and Trigeminy were present.  Past Medical History:  Diagnosis Date   Allergic rhinitis    Arthritis    Benign prostate hyperplasia    Chronic kidney disease    GERD (gastroesophageal reflux disease)    Hypertension    Tachycardia    Tremor     Past Surgical History:  Procedure Laterality Date   CARDIAC CATHETERIZATION     CATARACT EXTRACTION     COLONOSCOPY     HERNIA REPAIR  NOSE SURGERY      MEDICATIONS:  acetaminophen (TYLENOL) 650 MG CR tablet   aspirin 81 MG chewable tablet   carbidopa-levodopa (SINEMET CR) 50-200 MG tablet   carbidopa-levodopa (SINEMET IR) 25-100 MG tablet   famotidine-calcium carbonate-magnesium hydroxide (PEPCID COMPLETE) 10-800-165 MG chewable tablet   fluticasone (FLONASE) 50 MCG/ACT nasal spray   loratadine (CLARITIN) 10 MG tablet   metoprolol tartrate (LOPRESSOR) 25 MG tablet   polyvinyl alcohol (LIQUIFILM TEARS) 1.4 % ophthalmic solution    lidocaine (XYLOCAINE) 1 % (with pres) injection 5 mL   Ubaldo Glassing, PA-C [facility] Surgical Short Stay/Anesthesiology [facility] Phone [phone] 01/31/2023 8:56 AM

## 2023-02-03 ENCOUNTER — Ambulatory Visit (HOSPITAL_BASED_OUTPATIENT_CLINIC_OR_DEPARTMENT_OTHER): Payer: Medicare Other

## 2023-02-03 ENCOUNTER — Observation Stay (HOSPITAL_COMMUNITY)
Admission: RE | Admit: 2023-02-03 | Discharge: 2023-02-05 | Disposition: A | Discharge status: Home / Self Care | Payer: Medicare Other | Attending: Orthopedic Surgery | Admitting: Orthopedic Surgery

## 2023-02-03 ENCOUNTER — Ambulatory Visit (HOSPITAL_COMMUNITY): Payer: Medicare Other | Admitting: Medical

## 2023-02-03 ENCOUNTER — Encounter (HOSPITAL_COMMUNITY): Payer: Self-pay | Admitting: Orthopedic Surgery

## 2023-02-03 ENCOUNTER — Encounter (HOSPITAL_COMMUNITY)
Admission: RE | Disposition: A | Discharge status: Home / Self Care | Payer: Self-pay | Source: Home / Self Care | Attending: Orthopedic Surgery

## 2023-02-03 ENCOUNTER — Other Ambulatory Visit: Payer: Self-pay

## 2023-02-03 DIAGNOSIS — I10 Essential (primary) hypertension: Secondary | ICD-10-CM | POA: Diagnosis not present

## 2023-02-03 DIAGNOSIS — N289 Disorder of kidney and ureter, unspecified: Secondary | ICD-10-CM

## 2023-02-03 DIAGNOSIS — M1711 Unilateral primary osteoarthritis, right knee: Principal | ICD-10-CM | POA: Insufficient documentation

## 2023-02-03 DIAGNOSIS — Z79899 Other long term (current) drug therapy: Secondary | ICD-10-CM | POA: Insufficient documentation

## 2023-02-03 DIAGNOSIS — I129 Hypertensive chronic kidney disease with stage 1 through stage 4 chronic kidney disease, or unspecified chronic kidney disease: Secondary | ICD-10-CM | POA: Diagnosis not present

## 2023-02-03 DIAGNOSIS — Z7982 Long term (current) use of aspirin: Secondary | ICD-10-CM | POA: Diagnosis not present

## 2023-02-03 DIAGNOSIS — G8918 Other acute postprocedural pain: Secondary | ICD-10-CM | POA: Diagnosis not present

## 2023-02-03 DIAGNOSIS — N189 Chronic kidney disease, unspecified: Secondary | ICD-10-CM | POA: Diagnosis not present

## 2023-02-03 HISTORY — PX: TOTAL KNEE ARTHROPLASTY: SHX125

## 2023-02-03 SURGERY — ARTHROPLASTY, KNEE, TOTAL
Anesthesia: Spinal | Site: Knee | Laterality: Right

## 2023-02-03 MED ORDER — PROPOFOL 500 MG/50ML IV EMUL
INTRAVENOUS | Status: DC | PRN
  Administered 2023-02-03: 65 ug/kg/min via INTRAVENOUS

## 2023-02-03 MED ORDER — ONDANSETRON HCL 4 MG/2ML IJ SOLN: 4.0000 mg | Freq: Once | INTRAMUSCULAR | Status: DC | PRN

## 2023-02-03 MED ORDER — PHENOL 1.4 % MT LIQD: 1.0000 | OROMUCOSAL | Status: DC | PRN

## 2023-02-03 MED ORDER — SODIUM CHLORIDE (PF) 0.9 % IJ SOLN
INTRAMUSCULAR | Status: DC | PRN
  Administered 2023-02-03: 60 mL

## 2023-02-03 MED ORDER — METOPROLOL TARTRATE 12.5 MG HALF TABLET
12.5000 mg | ORAL_TABLET | Freq: Two times a day (BID) | ORAL | Status: DC
  Administered 2023-02-03 – 2023-02-05 (×4): 12.5 mg via ORAL
  Filled 2023-02-03 (×4): qty 1

## 2023-02-03 MED ORDER — CARBIDOPA-LEVODOPA ER 50-200 MG PO TBCR
1.0000 | EXTENDED_RELEASE_TABLET | Freq: Every day | ORAL | Status: DC
  Administered 2023-02-03 – 2023-02-04 (×2): 1 via ORAL
  Filled 2023-02-03 (×3): qty 1

## 2023-02-03 MED ORDER — FLUTICASONE PROPIONATE 50 MCG/ACT NA SUSP
1.0000 | Freq: Every day | NASAL | Status: DC
  Filled 2023-02-03: qty 16

## 2023-02-03 MED ORDER — LORATADINE 10 MG PO TABS
10.0000 mg | ORAL_TABLET | Freq: Every day | ORAL | Status: DC
  Administered 2023-02-04 – 2023-02-05 (×2): 10 mg via ORAL
  Filled 2023-02-03 (×2): qty 1

## 2023-02-03 MED ORDER — ONDANSETRON HCL 4 MG/2ML IJ SOLN
INTRAMUSCULAR | Status: DC | PRN
  Administered 2023-02-03: 4 mg via INTRAVENOUS

## 2023-02-03 MED ORDER — SODIUM CHLORIDE 0.9 % IV SOLN: INTRAVENOUS | Status: DC

## 2023-02-03 MED ORDER — PHENYLEPHRINE HCL-NACL 20-0.9 MG/250ML-% IV SOLN
INTRAVENOUS | Status: DC | PRN
  Administered 2023-02-03: 25 ug/min via INTRAVENOUS

## 2023-02-03 MED ORDER — OXYCODONE HCL 5 MG PO TABS
5.0000 mg | ORAL_TABLET | ORAL | Status: DC | PRN
  Administered 2023-02-03: 10 mg via ORAL
  Administered 2023-02-04 (×2): 5 mg via ORAL
  Administered 2023-02-04: 10 mg via ORAL
  Administered 2023-02-04 – 2023-02-05 (×4): 5 mg via ORAL
  Filled 2023-02-03: qty 1
  Filled 2023-02-03: qty 2
  Filled 2023-02-03: qty 1
  Filled 2023-02-03: qty 2
  Filled 2023-02-03 (×2): qty 1
  Filled 2023-02-03: qty 2
  Filled 2023-02-03: qty 1

## 2023-02-03 MED ORDER — DEXAMETHASONE SODIUM PHOSPHATE 10 MG/ML IJ SOLN
INTRAMUSCULAR | Status: DC | PRN
  Administered 2023-02-03: 10 mg

## 2023-02-03 MED ORDER — CEFAZOLIN SODIUM-DEXTROSE 2-4 GM/100ML-% IV SOLN
2.0000 g | INTRAVENOUS | Status: AC
  Administered 2023-02-03: 2 g via INTRAVENOUS
  Filled 2023-02-03: qty 100

## 2023-02-03 MED ORDER — ONDANSETRON HCL 4 MG PO TABS: 4.0000 mg | ORAL_TABLET | Freq: Four times a day (QID) | ORAL | Status: DC | PRN

## 2023-02-03 MED ORDER — TRAMADOL HCL 50 MG PO TABS
50.0000 mg | ORAL_TABLET | Freq: Four times a day (QID) | ORAL | Status: DC | PRN
  Administered 2023-02-03: 50 mg via ORAL
  Administered 2023-02-04 – 2023-02-05 (×2): 100 mg via ORAL
  Filled 2023-02-03: qty 1
  Filled 2023-02-03 (×2): qty 2

## 2023-02-03 MED ORDER — METOCLOPRAMIDE HCL 5 MG/ML IJ SOLN: 5.0000 mg | Freq: Three times a day (TID) | INTRAMUSCULAR | Status: DC | PRN

## 2023-02-03 MED ORDER — LACTATED RINGERS IV SOLN: INTRAVENOUS | Status: DC

## 2023-02-03 MED ORDER — MORPHINE SULFATE (PF) 2 MG/ML IV SOLN
1.0000 mg | INTRAVENOUS | Status: DC | PRN
  Administered 2023-02-03: 2 mg via INTRAVENOUS
  Filled 2023-02-03: qty 1

## 2023-02-03 MED ORDER — SODIUM CHLORIDE 0.9 % IR SOLN
Status: DC | PRN
  Administered 2023-02-03: 1000 mL

## 2023-02-03 MED ORDER — POLYVINYL ALCOHOL 1.4 % OP SOLN: 1.0000 [drp] | Freq: Every day | OPHTHALMIC | Status: DC | PRN

## 2023-02-03 MED ORDER — PHENYLEPHRINE HCL-NACL 20-0.9 MG/250ML-% IV SOLN
INTRAVENOUS | Status: AC
  Filled 2023-02-03: qty 250

## 2023-02-03 MED ORDER — DEXAMETHASONE SODIUM PHOSPHATE 10 MG/ML IJ SOLN
INTRAMUSCULAR | Status: AC
  Filled 2023-02-03: qty 1

## 2023-02-03 MED ORDER — FENTANYL CITRATE PF 50 MCG/ML IJ SOSY
50.0000 ug | PREFILLED_SYRINGE | Freq: Once | INTRAMUSCULAR | Status: AC
  Administered 2023-02-03: 50 ug via INTRAVENOUS
  Filled 2023-02-03: qty 2

## 2023-02-03 MED ORDER — BUPIVACAINE LIPOSOME 1.3 % IJ SUSP
INTRAMUSCULAR | Status: DC | PRN
  Administered 2023-02-03: 20 mL

## 2023-02-03 MED ORDER — DIPHENHYDRAMINE HCL 12.5 MG/5ML PO ELIX: 12.5000 mg | ORAL_SOLUTION | ORAL | Status: DC | PRN

## 2023-02-03 MED ORDER — 0.9 % SODIUM CHLORIDE (POUR BTL) OPTIME
TOPICAL | Status: DC | PRN
  Administered 2023-02-03: 1000 mL

## 2023-02-03 MED ORDER — BISACODYL 10 MG RE SUPP: 10.0000 mg | Freq: Every day | RECTAL | Status: DC | PRN

## 2023-02-03 MED ORDER — DEXAMETHASONE SODIUM PHOSPHATE 10 MG/ML IJ SOLN
8.0000 mg | Freq: Once | INTRAMUSCULAR | Status: AC
  Administered 2023-02-03: 8 mg via INTRAVENOUS

## 2023-02-03 MED ORDER — POLYETHYLENE GLYCOL 3350 17 G PO PACK: 17.0000 g | PACK | Freq: Every day | ORAL | Status: DC | PRN

## 2023-02-03 MED ORDER — CEFAZOLIN SODIUM-DEXTROSE 2-4 GM/100ML-% IV SOLN
2.0000 g | Freq: Four times a day (QID) | INTRAVENOUS | Status: AC
  Administered 2023-02-03 – 2023-02-04 (×2): 2 g via INTRAVENOUS
  Filled 2023-02-03 (×2): qty 100

## 2023-02-03 MED ORDER — ONDANSETRON HCL 4 MG/2ML IJ SOLN: 4.0000 mg | Freq: Four times a day (QID) | INTRAMUSCULAR | Status: DC | PRN

## 2023-02-03 MED ORDER — ACETAMINOPHEN 10 MG/ML IV SOLN
1000.0000 mg | Freq: Four times a day (QID) | INTRAVENOUS | Status: DC
  Administered 2023-02-03: 1000 mg via INTRAVENOUS
  Filled 2023-02-03: qty 100

## 2023-02-03 MED ORDER — ROPIVACAINE HCL 5 MG/ML IJ SOLN
INTRAMUSCULAR | Status: DC | PRN
  Administered 2023-02-03: 20 mL via PERINEURAL

## 2023-02-03 MED ORDER — ACETAMINOPHEN 500 MG PO TABS
1000.0000 mg | ORAL_TABLET | Freq: Four times a day (QID) | ORAL | Status: AC
  Administered 2023-02-03 – 2023-02-04 (×4): 1000 mg via ORAL
  Filled 2023-02-03 (×4): qty 2

## 2023-02-03 MED ORDER — METHOCARBAMOL 500 MG PO TABS
500.0000 mg | ORAL_TABLET | Freq: Four times a day (QID) | ORAL | Status: DC | PRN
  Administered 2023-02-03 – 2023-02-05 (×3): 500 mg via ORAL
  Filled 2023-02-03 (×3): qty 1

## 2023-02-03 MED ORDER — STERILE WATER FOR IRRIGATION IR SOLN
Status: DC | PRN
  Administered 2023-02-03: 2000 mL

## 2023-02-03 MED ORDER — ONDANSETRON HCL 4 MG/2ML IJ SOLN
INTRAMUSCULAR | Status: AC
  Filled 2023-02-03: qty 2

## 2023-02-03 MED ORDER — DEXAMETHASONE SODIUM PHOSPHATE 10 MG/ML IJ SOLN
10.0000 mg | Freq: Once | INTRAMUSCULAR | Status: AC
  Administered 2023-02-04: 10 mg via INTRAVENOUS
  Filled 2023-02-03: qty 1

## 2023-02-03 MED ORDER — TRANEXAMIC ACID-NACL 1000-0.7 MG/100ML-% IV SOLN
1000.0000 mg | INTRAVENOUS | Status: AC
  Administered 2023-02-03: 1000 mg via INTRAVENOUS
  Filled 2023-02-03: qty 100

## 2023-02-03 MED ORDER — CHLORHEXIDINE GLUCONATE 0.12 % MT SOLN
15.0000 mL | Freq: Once | OROMUCOSAL | Status: AC
  Administered 2023-02-03: 15 mL via OROMUCOSAL

## 2023-02-03 MED ORDER — PROPOFOL 1000 MG/100ML IV EMUL
INTRAVENOUS | Status: AC
  Filled 2023-02-03: qty 100

## 2023-02-03 MED ORDER — CARBIDOPA-LEVODOPA 25-100 MG PO TABS
1.0000 | ORAL_TABLET | ORAL | Status: DC
  Administered 2023-02-03 – 2023-02-05 (×7): 1 via ORAL
  Filled 2023-02-03 (×7): qty 1

## 2023-02-03 MED ORDER — SODIUM CHLORIDE (PF) 0.9 % IJ SOLN
INTRAMUSCULAR | Status: AC
  Filled 2023-02-03: qty 10

## 2023-02-03 MED ORDER — ORAL CARE MOUTH RINSE: 15.0000 mL | Freq: Once | OROMUCOSAL | Status: AC

## 2023-02-03 MED ORDER — POVIDONE-IODINE 10 % EX SWAB
2.0000 | Freq: Once | CUTANEOUS | Status: AC
  Administered 2023-02-03: 2 via TOPICAL

## 2023-02-03 MED ORDER — BUPIVACAINE IN DEXTROSE 0.75-8.25 % IT SOLN
INTRATHECAL | Status: DC | PRN
  Administered 2023-02-03: 1.6 mL via INTRATHECAL

## 2023-02-03 MED ORDER — METHOCARBAMOL 1000 MG/10ML IJ SOLN: 500.0000 mg | Freq: Four times a day (QID) | INTRAVENOUS | Status: DC | PRN

## 2023-02-03 MED ORDER — ASPIRIN 81 MG PO CHEW
81.0000 mg | CHEWABLE_TABLET | Freq: Two times a day (BID) | ORAL | Status: DC
  Administered 2023-02-04 – 2023-02-05 (×3): 81 mg via ORAL
  Filled 2023-02-03 (×3): qty 1

## 2023-02-03 MED ORDER — MENTHOL 3 MG MT LOZG: 1.0000 | LOZENGE | OROMUCOSAL | Status: DC | PRN

## 2023-02-03 MED ORDER — SODIUM CHLORIDE (PF) 0.9 % IJ SOLN
INTRAMUSCULAR | Status: AC
  Filled 2023-02-03: qty 50

## 2023-02-03 MED ORDER — DOCUSATE SODIUM 100 MG PO CAPS
100.0000 mg | ORAL_CAPSULE | Freq: Two times a day (BID) | ORAL | Status: DC
  Administered 2023-02-03 – 2023-02-05 (×4): 100 mg via ORAL
  Filled 2023-02-03 (×4): qty 1

## 2023-02-03 MED ORDER — METOCLOPRAMIDE HCL 5 MG PO TABS: 5.0000 mg | ORAL_TABLET | Freq: Three times a day (TID) | ORAL | Status: DC | PRN

## 2023-02-03 MED ORDER — BUPIVACAINE LIPOSOME 1.3 % IJ SUSP: 20.0000 mL | Freq: Once | INTRAMUSCULAR | Status: DC

## 2023-02-03 MED ORDER — FENTANYL CITRATE PF 50 MCG/ML IJ SOSY: 25.0000 ug | PREFILLED_SYRINGE | INTRAMUSCULAR | Status: DC | PRN

## 2023-02-03 MED ORDER — BUPIVACAINE LIPOSOME 1.3 % IJ SUSP
INTRAMUSCULAR | Status: AC
  Filled 2023-02-03: qty 20

## 2023-02-03 MED ORDER — FLEET ENEMA 7-19 GM/118ML RE ENEM: 1.0000 | ENEMA | Freq: Once | RECTAL | Status: DC | PRN

## 2023-02-03 MED ORDER — PROPOFOL 10 MG/ML IV BOLUS
INTRAVENOUS | Status: DC | PRN
  Administered 2023-02-03: 20 mg via INTRAVENOUS

## 2023-02-03 MED ORDER — FAMOTIDINE-CA CARB-MAG HYDROX 10-800-165 MG PO CHEW: 1.0000 | CHEWABLE_TABLET | Freq: Every day | ORAL | Status: DC | PRN

## 2023-02-03 SURGICAL SUPPLY — 59 items
ADH SKN CLS APL DERMABOND .7 (GAUZE/BANDAGES/DRESSINGS) ×1
ATTUNE MED DOME PAT 41 KNEE (Knees) IMPLANT
ATTUNE PS FEM RT SZ 8 CEM KNEE (Femur) IMPLANT
BAG COUNTER SPONGE SURGICOUNT (BAG) IMPLANT
BAG SPEC THK2 15X12 ZIP CLS (MISCELLANEOUS) ×1
BAG SPNG CNTER NS LX DISP (BAG)
BAG ZIPLOCK 12X15 (MISCELLANEOUS) ×1 IMPLANT
BASE TIBIAL ROT PLAT SZ 8 KNEE (Knees) IMPLANT
BLADE SAG 18X100X1.27 (BLADE) ×1 IMPLANT
BLADE SAW SGTL 11.0X1.19X90.0M (BLADE) ×1 IMPLANT
BNDG CMPR 5X62 HK CLSR LF (GAUZE/BANDAGES/DRESSINGS) ×1
BNDG CMPR 6"X 5 YARDS HK CLSR (GAUZE/BANDAGES/DRESSINGS) ×1
BNDG CMPR MED 10X6 ELC LF (GAUZE/BANDAGES/DRESSINGS) ×1
BNDG ELASTIC 6INX 5YD STR LF (GAUZE/BANDAGES/DRESSINGS) ×1 IMPLANT
BNDG ELASTIC 6X10 VLCR STRL LF (GAUZE/BANDAGES/DRESSINGS) IMPLANT
BOWL SMART MIX CTS (DISPOSABLE) ×1 IMPLANT
BSPLAT TIB 8 CMNT ROT PLAT STR (Knees) ×1 IMPLANT
CEMENT HV SMART SET (Cement) ×2 IMPLANT
COVER SURGICAL LIGHT HANDLE (MISCELLANEOUS) ×1 IMPLANT
CUFF TOURN SGL QUICK 34 (TOURNIQUET CUFF) ×1
CUFF TRNQT CYL 34X4.125X (TOURNIQUET CUFF) ×1 IMPLANT
DERMABOND ADVANCED .7 DNX12 (GAUZE/BANDAGES/DRESSINGS) ×1 IMPLANT
DRAPE INCISE IOBAN 66X45 STRL (DRAPES) ×1 IMPLANT
DRAPE U-SHAPE 47X51 STRL (DRAPES) ×1 IMPLANT
DRSG AQUACEL AG ADV 3.5X10 (GAUZE/BANDAGES/DRESSINGS) ×1 IMPLANT
DURAPREP 26ML APPLICATOR (WOUND CARE) ×1 IMPLANT
ELECT REM PT RETURN 15FT ADLT (MISCELLANEOUS) ×1 IMPLANT
GLOVE BIO SURGEON STRL SZ 6.5 (GLOVE) IMPLANT
GLOVE BIO SURGEON STRL SZ8 (GLOVE) ×1 IMPLANT
GLOVE BIOGEL PI IND STRL 6.5 (GLOVE) IMPLANT
GLOVE BIOGEL PI IND STRL 7.0 (GLOVE) IMPLANT
GLOVE BIOGEL PI IND STRL 8 (GLOVE) ×1 IMPLANT
GOWN STRL REUS W/ TWL LRG LVL3 (GOWN DISPOSABLE) ×1 IMPLANT
GOWN STRL REUS W/TWL LRG LVL3 (GOWN DISPOSABLE) ×1
HANDPIECE INTERPULSE COAX TIP (DISPOSABLE) ×1
HOLDER FOLEY CATH W/STRAP (MISCELLANEOUS) IMPLANT
IMMOBILIZER KNEE 20 (SOFTGOODS) ×1
IMMOBILIZER KNEE 20 THIGH 36 (SOFTGOODS) ×1 IMPLANT
IMMOBILIZER KNEE 22 UNIV (SOFTGOODS) IMPLANT
INSERT TIB ATTUNE RP SZ8X14 (Insert) IMPLANT
KIT TURNOVER KIT A (KITS) IMPLANT
MANIFOLD NEPTUNE II (INSTRUMENTS) ×1 IMPLANT
NS IRRIG 1000ML POUR BTL (IV SOLUTION) ×1 IMPLANT
PACK TOTAL KNEE CUSTOM (KITS) ×1 IMPLANT
PADDING CAST COTTON 6X4 STRL (CAST SUPPLIES) ×2 IMPLANT
PIN STEINMAN FIXATION KNEE (PIN) IMPLANT
PROTECTOR NERVE ULNAR (MISCELLANEOUS) ×1 IMPLANT
SET HNDPC FAN SPRY TIP SCT (DISPOSABLE) ×1 IMPLANT
SPIKE FLUID TRANSFER (MISCELLANEOUS) ×1 IMPLANT
SUT MNCRL AB 4-0 PS2 18 (SUTURE) ×1 IMPLANT
SUT STRATAFIX 0 PDS 27 VIOLET (SUTURE) ×1
SUT VIC AB 2-0 CT1 27 (SUTURE) ×3
SUT VIC AB 2-0 CT1 TAPERPNT 27 (SUTURE) ×3 IMPLANT
SUTURE STRATFX 0 PDS 27 VIOLET (SUTURE) ×1 IMPLANT
TIBIAL BASE ROT PLAT SZ 8 KNEE (Knees) ×1 IMPLANT
TRAY FOLEY MTR SLVR 16FR STAT (SET/KITS/TRAYS/PACK) ×1 IMPLANT
TUBE SUCTION HIGH CAP CLEAR NV (SUCTIONS) ×1 IMPLANT
WATER STERILE IRR 1000ML POUR (IV SOLUTION) ×2 IMPLANT
WRAP KNEE MAXI GEL POST OP (GAUZE/BANDAGES/DRESSINGS) ×1 IMPLANT

## 2023-02-03 NOTE — Op Note (Signed)
OPERATIVE REPORT-TOTAL KNEE ARTHROPLASTY   Pre-operative diagnosis- Osteoarthritis  Right knee(s)  Post-operative diagnosis- Osteoarthritis Right knee(s)  Procedure-  Right  Total Knee Arthroplasty  Surgeon- Gus Rankin. Jamiyah Dingley, MD  Assistant- Weston Brass, PA-C   Anesthesia-   Adductor canal block and spinal  EBL- 25 ml   Drains None  Tourniquet time-  Total Tourniquet Time Documented: Thigh (Right) - 45 minutes Total: Thigh (Right) - 45 minutes     Complications- None  Condition-PACU - hemodynamically stable.   Brief Clinical Note  Gerald Knapp. is a 89 y.o. year old male with end stage OA of his right knee with progressively worsening pain and dysfunction. He has constant pain, with activity and at rest and significant functional deficits with difficulties even with ADLs. He has had extensive non-op management including analgesics, injections of cortisone and viscosupplements, and home exercise program, but remains in significant pain with significant dysfunction. Radiographs show bone on bone arthritis medial and patellofemoral. He presents now for right Total Knee Arthroplasty.     Procedure in detail---   The patient is brought into the operating room and positioned supine on the operating table. After successful administration of  adductor canal block and spinal,   a tourniquet is placed high on the  Right thigh(s) and the lower extremity is prepped and draped in the usual sterile fashion. Time out is performed by the operating team and then the  Right lower extremity is wrapped in Esmarch, knee flexed and the tourniquet inflated to 300 mmHg.       A midline incision is made with a ten blade through the subcutaneous tissue to the level of the extensor mechanism. A fresh blade is used to make a medial parapatellar arthrotomy. Soft tissue over the proximal medial tibia is subperiosteally elevated to the joint line with a knife and into the semimembranosus bursa  with a Cobb elevator. Soft tissue over the proximal lateral tibia is elevated with attention being paid to avoiding the patellar tendon on the tibial tubercle. The patella is everted, knee flexed 90 degrees and the ACL and PCL are removed. Findings are bone on bone medial and patellofemoral with large global osteophytes and massive synovitis        The drill is used to create a starting hole in the distal femur and the canal is thoroughly irrigated with sterile saline to remove the fatty contents. The 5 degree Right  valgus alignment guide is placed into the femoral canal and the distal femoral cutting block is pinned to remove 9 mm off the distal femur. Resection is made with an oscillating saw.      The tibia is subluxed forward and the menisci are removed. The extramedullary alignment guide is placed referencing proximally at the medial aspect of the tibial tubercle and distally along the second metatarsal axis and tibial crest. The block is pinned to remove 2mm off the more deficient medial  side. Resection is made with an oscillating saw. Size 8is the most appropriate size for the tibia and the proximal tibia is prepared with the modular drill and keel punch for that size.      The femoral sizing guide is placed and size 8 is most appropriate. Rotation is marked off the epicondylar axis and confirmed by creating a rectangular flexion gap at 90 degrees. The size 8 cutting block is pinned in this rotation and the anterior, posterior and chamfer cuts are made with the oscillating saw. The intercondylar block is then  placed and that cut is made.      Trial size 8 tibial component, trial size 8 posterior stabilized femur and a 14  mm posterior stabilized rotating platform insert trial is placed. Full extension is achieved with excellent varus/valgus and anterior/posterior balance throughout full range of motion. The patella is everted and thickness measured to be 27  mm. Free hand resection is taken to 15 mm, a  41 template is placed, lug holes are drilled, trial patella is placed, and it tracks normally. Osteophytes are removed off the posterior femur with the trial in place. All trials are removed and the cut bone surfaces prepared with pulsatile lavage. Cement is mixed and once ready for implantation, the size 8 tibial implant, size  8 posterior stabilized femoral component, and the size 41 patella are cemented in place and the patella is held with the clamp. The trial insert is placed and the knee held in full extension. The Exparel (20 ml mixed with 60 ml saline) is injected into the extensor mechanism, posterior capsule, medial and lateral gutters and subcutaneous tissues.  All extruded cement is removed and once the cement is hard the permanent 14 mm posterior stabilized rotating platform insert is placed into the tibial tray.      The wound is copiously irrigated with saline solution and the extensor mechanism closed with # 0 Stratofix suture. The tourniquet is released for a total tourniquet time of 45  minutes. Flexion against gravity is 140 degrees and the patella tracks normally. Subcutaneous tissue is closed with 2.0 vicryl and subcuticular with running 4.0 Monocryl. The incision is cleaned and dried and steri-strips and a bulky sterile dressing are applied. The limb is placed into a knee immobilizer and the patient is awakened and transported to recovery in stable condition.      Please note that a surgical assistant was a medical necessity for this procedure in order to perform it in a safe and expeditious manner. Surgical assistant was necessary to retract the ligaments and vital neurovascular structures to prevent injury to them and also necessary for proper positioning of the limb to allow for anatomic placement of the prosthesis.   Gus Rankin Mariangela Heldt, MD    02/03/2023, 2:20 PM

## 2023-02-03 NOTE — Evaluation (Signed)
Physical Therapy Evaluation Patient Details Name: Gerald Knapp. MRN: [id number] DOB: [birth year] Today's Date: 02/03/2023  History of Present Illness  Pt is 89 yo male admitted on 02/03/23 for R TKA.  Pt with hx including but not limited to OA, Parkinsons, GERD, HTN, hernia repair  Clinical Impression  Pt is s/p TKA resulting in the deficits listed below (see PT Problem List). Pt from home with wife but also has multiple family members nearby that plan to stay to assist with recovery.  He is normally independent and ambulatory without AD but has been using RW for 2-3 months due to knee pain and severe genu varus.  At baseline, he sleeps in his lift chair and ambulates with trunk flexion.  Today, pt pleasant and motivated to work with therapy.  He has excellent quad activation and pain control.  Pt did have some decreased sensation in feet upon stand so was only able to take small steps to chair.  Did have shuffle gait pattern and tremors in hands that are increased from baseline - had not had Parkinsons meds since morning (missed 2 doses with surgery).  Pt expected to progress well with therapy.  Pt will benefit from acute skilled PT to increase their independence and safety with mobility to allow discharge.          Assistance Recommended at Discharge Frequent or constant Supervision/Assistance  If plan is discharge home, recommend the following:  Can travel by private vehicle  A lot of help with walking and/or transfers;A lot of help with bathing/dressing/bathroom;Assistance with cooking/housework;Help with stairs or ramp for entrance        Equipment Recommendations None recommended by PT (has toilet risers, RW, and w/c)  Recommendations for Other Services       Functional Status Assessment Patient has had a recent decline in their functional status and demonstrates the ability to make significant improvements in function in a reasonable and predictable amount of time.      Precautions / Restrictions Precautions Precautions: Fall;Knee Restrictions Weight Bearing Restrictions: Yes RLE Weight Bearing: Weight bearing as tolerated      Mobility  Bed Mobility Overal bed mobility: Needs Assistance Bed Mobility: Supine to Sit     Supine to sit: Mod assist     General bed mobility comments: assist for legs and trunk with bed pad to assist; of note does not sleep in bed at baseline    Transfers Overall transfer level: Needs assistance Equipment used: Rolling walker (2 wheels) Transfers: Sit to/from Stand Sit to Stand: Min assist, +2 physical assistance, From elevated surface           General transfer comment: cues for hand placement and R LE management; increased time to initiate and rise    Ambulation/Gait Ambulation/Gait assistance: Min assist, +2 safety/equipment Gait Distance (Feet): 2 Feet Assistive device: Rolling walker (2 wheels) Gait Pattern/deviations: Step-to pattern, Decreased stance time - right, Trunk flexed, Shuffle Gait velocity: decreased     General Gait Details: Upon standing reports balls of feet still feel numb , so only took small steps to chair; pt leans forward with increased hip/trunk flexion - family reports that is baseline  Acupuncturist Bed    Modified Rankin (Stroke Patients Only)       Balance Overall balance assessment: Needs assistance Sitting-balance support: No upper extremity supported Sitting balance-Leahy Scale: Fair Sitting balance - Comments: tendency  to lean posteriorly initially and with challenges   Standing balance support: Bilateral upper extremity supported, Reliant on assistive device for balance Standing balance-Leahy Scale: Poor Standing balance comment: RW and min A                             Pertinent Vitals/Pain Pain Assessment Pain Assessment: Faces Faces Pain Scale: Hurts a little bit Pain Location: R knee Pain  Descriptors / Indicators: Discomfort Pain Intervention(s): Limited activity within patient's tolerance, Monitored during session, Premedicated before session, Ice applied    Home Living Family/patient expects to be discharged to:: Private residence Living Arrangements: Spouse/significant other Available Help at Discharge: Family;Available 24 hours/day (lives with wife but daughter, granddaughter, son in law will also be staying) Type of Home: House Home Access: Ramped entrance       Home Layout: Multi-level;Able to live on main level with bedroom/bathroom Home Equipment: Shower seat;Grab bars - tub/shower;Rolling Walker (2 wheels);Toilet riser;Wheelchair - manual Additional Comments: sleeps in lift chair    Prior Function Prior Level of Function : Independent/Modified Independent             Mobility Comments: Was ambulating with RW for 2-3 months due to pain and only able to do short community distance; prior to that could ambulate in community.  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  Of note - sleeps in lift chair b/c difficulty getting in/out of bed baseline with Parkinsons and spinal stenosis ADLs Comments: independent with adls; could do light iadls     Hand Dominance        Extremity/Trunk Assessment   Upper Extremity Assessment Upper Extremity Assessment: Overall WFL for tasks assessed    Lower Extremity Assessment Lower Extremity Assessment: RLE deficits/detail RLE Deficits / Details: Expected post op changes; able to SLR without ext lag; ROM: knee ~5 to 80 degrees; MMT: ankle 5/5, knee and hip 3/5 not further tested; reports extreme varus prior to sx    Cervical / Trunk Assessment Cervical / Trunk Assessment: Other exceptions Cervical / Trunk Exceptions: Leans forward in standing  Communication   Communication: No difficulties  Cognition Arousal/Alertness: Awake/alert Behavior During Therapy: WFL for tasks assessed/performed Overall  Cognitive Status: Within Functional Limits for tasks assessed                                 General Comments: Pt with hx of Parkinson's needing increased time to initiate mobility at times, but otherwise follows commands, oriented, and whitty        General Comments General comments (skin integrity, edema, etc.): multiple family members present including daughter, son, son in Social worker, wife, and granddaughter    Exercises     Assessment/Plan    PT Assessment Patient needs continued PT services  PT Problem List Decreased strength;Decreased mobility;Decreased range of motion;Decreased activity tolerance;Decreased knowledge of precautions;Decreased balance;Decreased knowledge of use of DME       PT Treatment Interventions DME instruction;Therapeutic activities;Modalities;Gait training;Therapeutic exercise;Patient/family education;Functional mobility training;Balance training    PT Goals (Current goals can be found in the Care Plan section)  Acute Rehab PT Goals Patient Stated Goal: return home PT Goal Formulation: With patient/family Time For Goal Achievement: 02/17/23 Potential to Achieve Goals: Good    Frequency 7X/week     Co-evaluation  AM-PAC PT "6 Clicks" Mobility  Outcome Measure Help needed turning from your back to your side while in a flat bed without using bedrails?: A Little Help needed moving from lying on your back to sitting on the side of a flat bed without using bedrails?: A Lot Help needed moving to and from a bed to a chair (including a wheelchair)?: A Little Help needed standing up from a chair using your arms (e.g., wheelchair or bedside chair)?: A Little Help needed to walk in hospital room?: Total Help needed climbing 3-5 steps with a railing? : Total 6 Click Score: 13    End of Session Equipment Utilized During Treatment: Gait belt Activity Tolerance: Patient tolerated treatment well Patient left: in chair;with call  bell/phone within reach;with chair alarm set;with family/visitor present Nurse Communication: Mobility status PT Visit Diagnosis: Other abnormalities of gait and mobility (R26.89);Unsteadiness on feet (R26.81);Muscle weakness (generalized) (M62.81)    Time: 1700-1733 PT Time Calculation (min) (ACUTE ONLY): 33 min   Charges:   PT Evaluation $PT Eval Moderate Complexity: 1 Mod PT Treatments $Therapeutic Activity: 8-22 mins PT General Charges $$ ACUTE PT VISIT: 1 Visit         Anise Salvo, PT Acute Rehab [facility] [phone]   Rayetta Humphrey 02/03/2023, 5:48 PM

## 2023-02-03 NOTE — Transfer of Care (Signed)
Immediate Anesthesia Transfer of Care Note  Patient: Gerald Knapp.  Procedure(s) Performed: TOTAL KNEE ARTHROPLASTY (Right: Knee)  Patient Location: PACU  Anesthesia Type:Spinal  Level of Consciousness: drowsy and responds to stimulation  Airway & Oxygen Therapy: Patient Spontanous Breathing and Patient connected to face mask oxygen  Post-op Assessment: Report given to RN and Post -op Vital signs reviewed and stable  Post vital signs: Reviewed and stable  Last Vitals:  Vitals Value Taken Time  BP 160/81 02/03/23 1451  Temp    Pulse 57 02/03/23 1454  Resp 15 02/03/23 1454  SpO2 100 % 02/03/23 1454  Vitals shown include unvalidated device data.  Last Pain:  Vitals:   02/03/23 1220  TempSrc:   PainSc: 3          Complications: No notable events documented.

## 2023-02-03 NOTE — Interval H&P Note (Signed)
History and Physical Interval Note:  02/03/2023 10:36 AM  Gerald Knapp.  has presented today for surgery, with the diagnosis of right knee osteoarthritis.  The various methods of treatment have been discussed with the patient and family. After consideration of risks, benefits and other options for treatment, the patient has consented to  Procedure(s): TOTAL KNEE ARTHROPLASTY (Right) as a surgical intervention.  The patient's history has been reviewed, patient examined, no change in status, stable for surgery.  I have reviewed the patient's chart and labs.  Questions were answered to the patient's satisfaction.     [name]

## 2023-02-03 NOTE — Care Plan (Signed)
Ortho Bundle Case Management Note  Patient Details  Name: Gerald Knapp. MRN: [id number] Date of Birth: 12-09-1930                  R TKA on 02-03-23  DCP: Home with family  DME: No needs, has a RW  PT: [facility] on 02-06-23   DME Arranged:  N/A DME Agency:       Additional Comments: Please contact me with any questions of if this plan should need to change.   Ennis Forts, RN,CCM [facility]  [phone] 02/03/2023, 12:50 PM

## 2023-02-03 NOTE — Plan of Care (Signed)
  Problem: Activity: Goal: Ability to avoid complications of mobility impairment will improve Outcome: Progressing Goal: Range of joint motion will improve Outcome: Progressing   Problem: Pain Management: Goal: Pain level will decrease with appropriate interventions Outcome: Progressing   Problem: Safety: Goal: Ability to remain free from injury will improve Outcome: Progressing   

## 2023-02-03 NOTE — Plan of Care (Signed)
  Problem: Education: Goal: Knowledge of the prescribed therapeutic regimen will improve Outcome: Progressing Goal: Individualized Educational Video(s) Outcome: Completed/Met   Problem: Activity: Goal: Ability to avoid complications of mobility impairment will improve Outcome: Progressing Goal: Range of joint motion will improve Outcome: Adequate for Discharge   Problem: Clinical Measurements: Goal: Postoperative complications will be avoided or minimized Outcome: Progressing   Problem: Pain Management: Goal: Pain level will decrease with appropriate interventions Outcome: Progressing   Problem: Skin Integrity: Goal: Will show signs of wound healing Outcome: Progressing   Problem: Education: Goal: Knowledge of General Education information will improve Description: Including pain rating scale, medication(s)/side effects and non-pharmacologic comfort measures Outcome: Progressing   Problem: Health Behavior/Discharge Planning: Goal: Ability to manage health-related needs will improve Outcome: Progressing   Problem: Clinical Measurements: Goal: Ability to maintain clinical measurements within normal limits will improve Outcome: Progressing Goal: Will remain free from infection Outcome: Progressing Goal: Diagnostic test results will improve Outcome: Progressing Goal: Respiratory complications will improve Outcome: Progressing Goal: Cardiovascular complication will be avoided Outcome: Progressing   Problem: Activity: Goal: Risk for activity intolerance will decrease Outcome: Progressing   Problem: Nutrition: Goal: Adequate nutrition will be maintained Outcome: Completed/Met   Problem: Coping: Goal: Level of anxiety will decrease Outcome: Progressing   Problem: Elimination: Goal: Will not experience complications related to bowel motility Outcome: Progressing Goal: Will not experience complications related to urinary retention Outcome: Progressing   Problem:  Pain Managment: Goal: General experience of comfort will improve Outcome: Progressing   Problem: Safety: Goal: Ability to remain free from injury will improve Outcome: Progressing   Problem: Skin Integrity: Goal: Risk for impaired skin integrity will decrease Outcome: Progressing   

## 2023-02-03 NOTE — Anesthesia Procedure Notes (Signed)
Spinal  Patient location during procedure: OR Start time: 02/03/2023 12:57 PM End time: 02/03/2023 1:00 PM Reason for block: surgical anesthesia Staffing Performed: anesthesiologist  Anesthesiologist: Collene Schlichter, MD Performed by: Collene Schlichter, MD Authorized by: Collene Schlichter, MD   Preanesthetic Checklist Completed: patient identified, IV checked, risks and benefits discussed, surgical consent, monitors and equipment checked, pre-op evaluation and timeout performed Spinal Block Patient position: sitting Prep: DuraPrep and site prepped and draped Patient monitoring: continuous pulse ox and blood pressure Approach: midline Location: L3-4 Injection technique: single-shot Needle Needle type: Pencan  Needle gauge: 24 G Assessment Events: CSF return Additional Notes Functioning IV was confirmed and monitors were applied. Sterile prep and drape, including hand hygiene, mask and sterile gloves were used. The patient was positioned and the spine was prepped. The skin was anesthetized with lidocaine.  Free flow of clear CSF was obtained prior to injecting local anesthetic into the CSF.  The spinal needle aspirated freely following injection.  The needle was carefully withdrawn.  The patient tolerated the procedure well. Consent was obtained prior to procedure with all questions answered and concerns addressed. Risks including but not limited to bleeding, infection, nerve damage, paralysis, failed block, inadequate analgesia, allergic reaction, high spinal, itching and headache were discussed and the patient wished to proceed.   Arrie Aran, MD

## 2023-02-03 NOTE — Discharge Instructions (Signed)
Gerald Gross, MD Total Joint Specialist [facility] [address] [place], [place] [address] [phone]  TOTAL KNEE REPLACEMENT POSTOPERATIVE DIRECTIONS    Knee Rehabilitation, Guidelines Following Surgery  Results after knee surgery are often greatly improved when you follow the exercise, range of motion and muscle strengthening exercises prescribed by your doctor. Safety measures are also important to protect the knee from further injury. If any of these exercises cause you to have increased pain or swelling in your knee joint, decrease the amount until you are comfortable again and slowly increase them. If you have problems or questions, call your caregiver or physical therapist for advice.   BLOOD CLOT PREVENTION Take 81 mg Aspirin two times a day for three weeks following surgery. Then resume your normal dose of 81 mg Aspirin once daily. You may resume your vitamins/supplements upon discharge from the hospital. Do not take any NSAIDs (Advil, Aleve, Ibuprofen, Meloxicam, etc.) for 3 weeks, while taking 81mg  Aspirin twice a day.   HOME CARE INSTRUCTIONS  Remove items at home which could result in a fall. This includes throw rugs or furniture in walking pathways.  ICE to the affected knee as much as tolerated. Icing helps control swelling. If the swelling is well controlled you will be more comfortable and rehab easier. Continue to use ice on the knee for pain and swelling from surgery. You may notice swelling that will progress down to the foot and ankle. This is normal after surgery. Elevate the leg when you are not up walking on it.    Continue to use the breathing machine which will help keep your temperature down. It is common for your temperature to cycle up and down following surgery, especially at night when you are not up moving around and exerting yourself. The breathing machine keeps your lungs expanded and your temperature down. Do not place  pillow under the operative knee, focus on keeping the knee straight while resting  DIET You may resume your previous home diet once you are discharged from the hospital.  DRESSING / WOUND CARE / SHOWERING Keep your bulky bandage on for 2 days. On the third post-operative day you may remove the Ace bandage and gauze. There is a waterproof adhesive bandage on your skin which will stay in place until your first follow-up appointment. Once you remove this you will not need to place another bandage You may begin showering 3 days following surgery, but do not submerge the incision under water.  ACTIVITY For the first 5 days, the key is rest and control of pain and swelling Do your home exercises twice a day starting on post-operative day 3. On the days you go to physical therapy, just do the home exercises once that day. You should rest, ice and elevate the leg for 50 minutes out of every hour. Get up and walk/stretch for 10 minutes per hour. After 5 days you can increase your activity slowly as tolerated. Walk with your walker as instructed. Use the walker until you are comfortable transitioning to a cane. Walk with the cane in the opposite hand of the operative leg. You may discontinue the cane once you are comfortable and walking steadily. Avoid periods of inactivity such as sitting longer than an hour when not asleep. This helps prevent blood clots.  You may discontinue the knee immobilizer once you are able to perform a straight leg raise while lying down. You may resume a sexual relationship in one month or when  given the OK by your doctor.  You may return to work once you are cleared by your doctor.  Do not drive a car for 6 weeks or until released by your surgeon.  Do not drive while taking narcotics.  TED HOSE STOCKINGS Wear the elastic stockings on both legs for three weeks following surgery during the day. You may remove them at night for sleeping.  WEIGHT BEARING Weight bearing as  tolerated with assist device (walker, cane, etc) as directed, use it as long as suggested by your surgeon or therapist, typically at least 4-6 weeks.  POSTOPERATIVE CONSTIPATION PROTOCOL Constipation - defined medically as fewer than three stools per week and severe constipation as less than one stool per week.  One of the most common issues patients have following surgery is constipation.  Even if you have a regular bowel pattern at home, your normal regimen is likely to be disrupted due to multiple reasons following surgery.  Combination of anesthesia, postoperative narcotics, change in appetite and fluid intake all can affect your bowels.  In order to avoid complications following surgery, here are some recommendations in order to help you during your recovery period.  Colace (docusate) - Pick up an over-the-counter form of Colace or another stool softener and take twice a day as long as you are requiring postoperative pain medications.  Take with a full glass of water daily.  If you experience loose stools or diarrhea, hold the colace until you stool forms back up. If your symptoms do not get better within 1 week or if they get worse, check with your doctor. Dulcolax (bisacodyl) - Pick up over-the-counter and take as directed by the product packaging as needed to assist with the movement of your bowels.  Take with a full glass of water.  Use this product as needed if not relieved by Colace only.  MiraLax (polyethylene glycol) - Pick up over-the-counter to have on hand. MiraLax is a solution that will increase the amount of water in your bowels to assist with bowel movements.  Take as directed and can mix with a glass of water, juice, soda, coffee, or tea. Take if you go more than two days without a movement. Do not use MiraLax more than once per day. Call your doctor if you are still constipated or irregular after using this medication for 7 days in a row.  If you continue to have problems with  postoperative constipation, please contact the office for further assistance and recommendations.  If you experience "the worst abdominal pain ever" or develop nausea or vomiting, please contact the office immediatly for further recommendations for treatment.  ITCHING If you experience itching with your medications, try taking only a single pain pill, or even half a pain pill at a time.  You can also use Benadryl over the counter for itching or also to help with sleep.   MEDICATIONS See your medication summary on the "After Visit Summary" that the nursing staff will review with you prior to discharge.  You may have some home medications which will be placed on hold until you complete the course of blood thinner medication.  It is important for you to complete the blood thinner medication as prescribed by your surgeon.  Continue your approved medications as instructed at time of discharge.  PRECAUTIONS If you experience chest pain or shortness of breath - call 911 immediately for transfer to the hospital emergency department.  If you develop a fever greater that 101 F, purulent drainage  from wound, increased redness or drainage from wound, foul odor from the wound/dressing, or calf pain - CONTACT YOUR SURGEON.                                                   FOLLOW-UP APPOINTMENTS Make sure you keep all of your appointments after your operation with your surgeon and caregivers. You should call the office at the above phone number and make an appointment for approximately two weeks after the date of your surgery or on the date instructed by your surgeon outlined in the "After Visit Summary".  RANGE OF MOTION AND STRENGTHENING EXERCISES  Rehabilitation of the knee is important following a knee injury or an operation. After just a few days of immobilization, the muscles of the thigh which control the knee become weakened and shrink (atrophy). Knee exercises are designed to build up the tone and strength  of the thigh muscles and to improve knee motion. Often times heat used for twenty to thirty minutes before working out will loosen up your tissues and help with improving the range of motion but do not use heat for the first two weeks following surgery. These exercises can be done on a training (exercise) mat, on the floor, on a table or on a bed. Use what ever works the best and is most comfortable for you Knee exercises include:  Leg Lifts - While your knee is still immobilized in a splint or cast, you can do straight leg raises. Lift the leg to 60 degrees, hold for 3 sec, and slowly lower the leg. Repeat 10-20 times 2-3 times daily. Perform this exercise against resistance later as your knee gets better.  Quad and Hamstring Sets - Tighten up the muscle on the front of the thigh (Quad) and hold for 5-10 sec. Repeat this 10-20 times hourly. Hamstring sets are done by pushing the foot backward against an object and holding for 5-10 sec. Repeat as with quad sets.  Leg Slides: Lying on your back, slowly slide your foot toward your buttocks, bending your knee up off the floor (only go as far as is comfortable). Then slowly slide your foot back down until your leg is flat on the floor again. Angel Wings: Lying on your back spread your legs to the side as far apart as you can without causing discomfort.  A rehabilitation program following serious knee injuries can speed recovery and prevent re-injury in the future due to weakened muscles. Contact your doctor or a physical therapist for more information on knee rehabilitation.   POST-OPERATIVE OPIOID TAPER INSTRUCTIONS: It is important to wean off of your opioid medication as soon as possible. If you do not need pain medication after your surgery it is ok to stop day one. Opioids include: Codeine, Hydrocodone(Norco, Vicodin), Oxycodone(Percocet, oxycontin) and hydromorphone amongst others.  Long term and even short term use of opiods can cause: Increased pain  response Dependence Constipation Depression Respiratory depression And more.  Withdrawal symptoms can include Flu like symptoms Nausea, vomiting And more Techniques to manage these symptoms Hydrate well Eat regular healthy meals Stay active Use relaxation techniques(deep breathing, meditating, yoga) Do Not substitute Alcohol to help with tapering If you have been on opioids for less than two weeks and do not have pain than it is ok to stop all together.  Plan to wean  off of opioids This plan should start within one week post op of your joint replacement. Maintain the same interval or time between taking each dose and first decrease the dose.  Cut the total daily intake of opioids by one tablet each day Next start to increase the time between doses. The last dose that should be eliminated is the evening dose.   IF YOU ARE TRANSFERRED TO A SKILLED REHAB FACILITY If the patient is transferred to a skilled rehab facility following release from the hospital, a list of the current medications will be sent to the facility for the patient to continue.  When discharged from the skilled rehab facility, please have the facility set up the patient's Home Health Physical Therapy prior to being released. Also, the skilled facility will be responsible for providing the patient with their medications at time of release from the facility to include their pain medication, the muscle relaxants, and their blood thinner medication. If the patient is still at the rehab facility at time of the two week follow up appointment, the skilled rehab facility will also need to assist the patient in arranging follow up appointment in our office and any transportation needs.  MAKE SURE YOU:  Understand these instructions.  Get help right away if you are not doing well or get worse.   DENTAL ANTIBIOTICS:  In most cases prophylactic antibiotics for Dental procdeures after total joint surgery are not  necessary.  Exceptions are as follows:  1. History of prior total joint infection  2. Severely immunocompromised (Organ Transplant, cancer chemotherapy, Rheumatoid biologic meds such as Humera)  3. Poorly controlled diabetes (A1C &gt; 8.0, blood glucose over 200)  If you have one of these conditions, contact your surgeon for an antibiotic prescription, prior to your dental procedure.    Pick up stool softner and laxative for home use following surgery while on pain medications. Do not submerge incision under water. Please use good hand washing techniques while changing dressing each day. May shower starting three days after surgery. Please use a clean towel to pat the incision dry following showers. Continue to use ice for pain and swelling after surgery. Do not use any lotions or creams on the incision until instructed by your surgeon.

## 2023-02-03 NOTE — Progress Notes (Signed)
Orthopedic Tech Progress Note Patient Details:  Gerald Knapp. 10/31/1930 [id number]   CPM Right Knee CPM Right Knee: On Right Knee Flexion (Degrees): 40 Right Knee Extension (Degrees): 10 Additional Comments: RLE  Post Interventions Patient Tolerated: Well Instructions Provided: Care of device, Adjustment of device  Sherilyn Banker 02/03/2023, 3:06 PM

## 2023-02-03 NOTE — Anesthesia Procedure Notes (Signed)
Anesthesia Regional Block: Adductor canal block   Pre-Anesthetic Checklist: , timeout performed,  Correct Patient, Correct Site, Correct Laterality,  Correct Procedure, Correct Position, site marked,  Risks and benefits discussed,  Surgical consent,  Pre-op evaluation,  At surgeon's request and post-op pain management  Laterality: Right  Prep: chloraprep       Needles:  Injection technique: Single-shot  Needle Type: Echogenic Needle     Needle Length: 9cm  Needle Gauge: 21     Additional Needles:   Procedures:,,,, ultrasound used (permanent image in chart),,    Narrative:  Start time: 02/03/2023 12:13 PM End time: 02/03/2023 12:20 PM Injection made incrementally with aspirations every 5 mL.  Performed by: Personally  Anesthesiologist: Collene Schlichter, MD  Additional Notes: No pain on injection. No increased resistance to injection. Injection made in 5cc increments.  Good needle visualization.  Patient tolerated procedure well.

## 2023-02-03 NOTE — Anesthesia Postprocedure Evaluation (Signed)
Anesthesia Post Note  Patient: Gerald Knapp.  Procedure(s) Performed: TOTAL KNEE ARTHROPLASTY (Right: Knee)     Patient location during evaluation: PACU Anesthesia Type: Spinal Level of consciousness: awake, awake and alert and oriented Pain management: pain level controlled Vital Signs Assessment: post-procedure vital signs reviewed and stable Respiratory status: spontaneous breathing, nonlabored ventilation and respiratory function stable Cardiovascular status: blood pressure returned to baseline and stable Postop Assessment: no headache, no backache, spinal receding and no apparent nausea or vomiting Anesthetic complications: no   No notable events documented.  Last Vitals:  Vitals:   02/03/23 1630 02/03/23 1655  BP: (!) 161/80 (!) 160/88  Pulse: (!) 51 74  Resp: 14 16  Temp:  36.6 C  SpO2: 100% 100%    Last Pain:  Vitals:   02/03/23 1655  TempSrc: Oral  PainSc: 0-No pain                 Collene Schlichter

## 2023-02-04 ENCOUNTER — Encounter (HOSPITAL_COMMUNITY): Payer: Self-pay | Admitting: Orthopedic Surgery

## 2023-02-04 DIAGNOSIS — N189 Chronic kidney disease, unspecified: Secondary | ICD-10-CM | POA: Diagnosis not present

## 2023-02-04 DIAGNOSIS — M1711 Unilateral primary osteoarthritis, right knee: Secondary | ICD-10-CM | POA: Diagnosis not present

## 2023-02-04 DIAGNOSIS — Z79899 Other long term (current) drug therapy: Secondary | ICD-10-CM | POA: Diagnosis not present

## 2023-02-04 DIAGNOSIS — Z7982 Long term (current) use of aspirin: Secondary | ICD-10-CM | POA: Diagnosis not present

## 2023-02-04 DIAGNOSIS — I129 Hypertensive chronic kidney disease with stage 1 through stage 4 chronic kidney disease, or unspecified chronic kidney disease: Secondary | ICD-10-CM | POA: Diagnosis not present

## 2023-02-04 LAB — BASIC METABOLIC PANEL
Anion gap: 8 (ref 5–15)
BUN: 21 mg/dL (ref 8–23)
CO2: 26 mmol/L (ref 22–32)
Calcium: 8.7 mg/dL — ABNORMAL LOW (ref 8.9–10.3)
Chloride: 101 mmol/L (ref 98–111)
Creatinine, Ser: 1.06 mg/dL (ref 0.61–1.24)
GFR, Estimated: >60 mL/min (ref >60)
Glucose, Bld: 162 mg/dL — ABNORMAL HIGH (ref 70–99)
Potassium: 4.2 mmol/L (ref 3.5–5.1)
Sodium: 135 mmol/L (ref 135–145)

## 2023-02-04 LAB — CBC
HCT: 33.6 % — ABNORMAL LOW (ref 39.0–52.0)
Hemoglobin: 10.7 g/dL — ABNORMAL LOW (ref 13.0–17.0)
MCH: 30.2 pg (ref 26.0–34.0)
MCHC: 31.8 g/dL (ref 30.0–36.0)
MCV: 94.9 fL (ref 80.0–100.0)
Platelets: 239 10*3/uL (ref 150–400)
RBC: 3.54 MIL/uL — ABNORMAL LOW (ref 4.22–5.81)
RDW: 12.8 % (ref 11.5–15.5)
WBC: 12 10*3/uL — ABNORMAL HIGH (ref 4.0–10.5)
nRBC: 0 % (ref 0.0–0.2)

## 2023-02-04 MED ORDER — OXYCODONE HCL 5 MG PO TABS: 5.0000 mg | ORAL_TABLET | Freq: Four times a day (QID) | ORAL | 0 refills | Status: DC | PRN

## 2023-02-04 MED ORDER — ALUM & MAG HYDROXIDE-SIMETH 200-200-20 MG/5ML PO SUSP
30.0000 mL | ORAL | Status: DC | PRN
  Filled 2023-02-04: qty 30

## 2023-02-04 MED ORDER — TRAMADOL HCL 50 MG PO TABS: 50.0000 mg | ORAL_TABLET | Freq: Four times a day (QID) | ORAL | 0 refills | Status: AC | PRN

## 2023-02-04 MED ORDER — METHOCARBAMOL 500 MG PO TABS: 500.0000 mg | ORAL_TABLET | Freq: Four times a day (QID) | ORAL | 0 refills | Status: DC | PRN

## 2023-02-04 MED ORDER — ASPIRIN 81 MG PO CHEW: 81.0000 mg | CHEWABLE_TABLET | Freq: Two times a day (BID) | ORAL | 0 refills | Status: AC

## 2023-02-04 MED ORDER — ONDANSETRON HCL 4 MG PO TABS: 4.0000 mg | ORAL_TABLET | Freq: Four times a day (QID) | ORAL | 0 refills | Status: DC | PRN

## 2023-02-04 NOTE — Care Management Obs Status (Signed)
MEDICARE OBSERVATION STATUS NOTIFICATION   Patient Details  Name: Gerald Knapp. MRN: [id number] Date of Birth: [birth year]   Medicare Observation Status Notification Given:  Yes    Ewing Schlein, LCSW 02/04/2023, 12:11 PM

## 2023-02-04 NOTE — Progress Notes (Signed)
Physical Therapy Treatment Patient Details Name: Gerald Knapp. MRN: [id number] DOB: 1931/05/11 Today's Date: 02/04/2023   History of Present Illness Pt is 89 yo male admitted on 02/03/23 for R TKA.  Pt with hx including but not limited to OA, Parkinsons, GERD, HTN, hernia repair    PT Comments  Pt with good improvements this afternoon.  He continues to have good pain control.  Pt reports he is used to walking more leaned over with RW further out front and would like to try - pt did demonstrate improved gait quality and tolerance in this manner.  He is slow to initiate steps requiring ~3' for step length and steadiness to improve and then needs chair follow as he buckles when fatigues.  He was able to increase distance to 80' and 35' this afternoon.  Pt has good ROM and quad activation. He reports pain and strength in R LE much improved compared to prior to surgery.  Continue plan of care - do feel pt needs further therapy prior to returning home due to easily fatigued/buckling.       Assistance Recommended at Discharge Frequent or constant Supervision/Assistance  If plan is discharge home, recommend the following:  Can travel by private vehicle    A lot of help with walking and/or transfers;A lot of help with bathing/dressing/bathroom;Assistance with cooking/housework;Help with stairs or ramp for entrance      Equipment Recommendations  None recommended by PT    Recommendations for Other Services       Precautions / Restrictions Precautions Precautions: Fall;Knee Restrictions RLE Weight Bearing: Weight bearing as tolerated     Mobility  Bed Mobility               General bed mobility comments: slept in chair as he does at home    Transfers Overall transfer level: Needs assistance Equipment used: Rolling walker (2 wheels) Transfers: Sit to/from Stand Sit to Stand: Min assist           General transfer comment: cues for hand placement and R LE management;  increased time to initiate and rise; performed x 2    Ambulation/Gait Ambulation/Gait assistance: Min assist, +2 safety/equipment Gait Distance (Feet): 35 Feet (35' then 25') Assistive device: Rolling walker (2 wheels) Gait Pattern/deviations: Step-to pattern, Decreased stance time - right, Trunk flexed Gait velocity: decreased     General Gait Details: Pt reports has been ambulating at home with RW more forward and is more comfortable in that manner so he doesn't feel that he is falling backward.  Tried with RW more forward and pt with improved tolerance.  He was able to ambulate 93' before fatiguing then 25' after 3-4 min seated rest break.  Pt unsteady with the first few steps -taking smaller steps and cues to straighten knees, improved after ~3'.  Does still need chair follow as when he fatigues begins to buckle   Stairs             Wheelchair Mobility     Tilt Bed    Modified Rankin (Stroke Patients Only)       Balance Overall balance assessment: Needs assistance Sitting-balance support: No upper extremity supported Sitting balance-Leahy Scale: Good Sitting balance - Comments: tendency to lean posteriorly initially and with challenges   Standing balance support: Bilateral upper extremity supported, Reliant on assistive device for balance Standing balance-Leahy Scale: Poor Standing balance comment: RW and min A; tendency to posterior lean  Cognition Arousal/Alertness: Awake/alert Behavior During Therapy: WFL for tasks assessed/performed Overall Cognitive Status: Within Functional Limits for tasks assessed                                 General Comments: Pt with hx of Parkinson's needing increased time to initiate mobility at times, but otherwise follows commands, oriented, and whitty        Exercises Total Joint Exercises Ankle Circles/Pumps: AROM, Both, 10 reps, Supine Quad Sets: AROM, Both, 10 reps,  Supine Heel Slides: AAROM, Right, 10 reps, Supine Hip ABduction/ADduction: AAROM, Right, 10 reps, Supine Long Arc Quad: AAROM, Right, 10 reps, Seated Knee Flexion: AAROM, Right, 10 reps, Seated Goniometric ROM: R knee ~5 degrees to 80 degrees Other Exercises Other Exercises: Pt tolerating all well and reports moving better than prior to surgery.  ENcouraged to perform ankle pumps and quad sets on his own throughout day bilaterally    General Comments        Pertinent Vitals/Pain Pain Assessment Pain Assessment: No/denies pain    Home Living                          Prior Function            PT Goals (current goals can now be found in the care plan section) Progress towards PT goals: Progressing toward goals    Frequency    7X/week      PT Plan Current plan remains appropriate    Co-evaluation              AM-PAC PT "6 Clicks" Mobility   Outcome Measure  Help needed turning from your back to your side while in a flat bed without using bedrails?: A Little Help needed moving from lying on your back to sitting on the side of a flat bed without using bedrails?: A Lot Help needed moving to and from a bed to a chair (including a wheelchair)?: A Little Help needed standing up from a chair using your arms (e.g., wheelchair or bedside chair)?: A Little Help needed to walk in hospital room?: A Lot Help needed climbing 3-5 steps with a railing? : Total 6 Click Score: 14    End of Session Equipment Utilized During Treatment: Gait belt Activity Tolerance: Patient tolerated treatment well Patient left: in chair;with call bell/phone within reach;with chair alarm set;with family/visitor present Nurse Communication: Mobility status PT Visit Diagnosis: Other abnormalities of gait and mobility (R26.89);Unsteadiness on feet (R26.81);Muscle weakness (generalized) (M62.81)     Time: 2841-3244 PT Time Calculation (min) (ACUTE ONLY): 27 min  Charges:    $Gait  Training: 8-22 mins $Therapeutic Exercise: 8-22 mins PT General Charges $$ ACUTE PT VISIT: 1 Visit                     Anise Salvo, PT Acute Rehab [facility] [phone]    Rayetta Humphrey 02/04/2023, 2:37 PM

## 2023-02-04 NOTE — TOC Transition Note (Signed)
Transition of Care [facility]) - CM/SW Discharge Note  Patient Details  Name: Gerald Knapp. MRN: [id number] Date of Birth: 1931/05/08  [facility] CM/SW Contact:  Ewing Schlein, LCSW Phone Number: 02/04/2023, 11:03 AM  Clinical Narrative: Patient is expected to discharge home after working with PT. CSW met with patient and family to confirm discharge plan. Patient will go home with OPPT at [facility]. Patient has a rolling walker at home, so there are no DME needs at this time. TOC signing off.    Final next level of care: OP Rehab Barriers to Discharge: No Barriers Identified  Patient Goals and CMS Choice Choice offered to / list presented to : NA  Discharge Plan and Services Additional resources added to the After Visit Summary for          DME Arranged: N/A DME Agency: NA  Social Determinants of Health (SDOH) Interventions SDOH Screenings   Food Insecurity: No Food Insecurity (02/03/2023)  Housing: Low Risk  (02/03/2023)  Transportation Needs: No Transportation Needs (02/03/2023)  Utilities: Not At Risk (02/03/2023)  Depression (PHQ2-9): Low Risk  (05/21/2022)  Tobacco Use: Low Risk  (02/03/2023)   Readmission Risk Interventions     No data to display

## 2023-02-04 NOTE — Progress Notes (Signed)
   Subjective: 1 Day Post-Op Procedure(s) (LRB): TOTAL KNEE ARTHROPLASTY (Right) Patient reports pain as mild.   Patient seen in rounds by Dr. Lequita Halt. Patient is well, and has had no acute complaints or problems No issues overnight. Denies chest pain, SOB, or calf pain. Foley catheter removed this AM.  We will continue therapy today, ambulated 2' yesterday.   Objective: Vital signs in last 24 hours: Temp:  [97.5 F (36.4 C)-98.4 F (36.9 C)] 98.4 F (36.9 C) (07/09 0607) Pulse Rate:  [51-86] 68 (07/09 0607) Resp:  [12-19] 17 (07/09 0607) BP: (114-171)/(67-119) 114/67 (07/09 0607) SpO2:  [91 %-100 %] 98 % (07/09 0607) Weight:  [77.1 kg] 77.1 kg (07/08 1703)  Intake/Output from previous day:  Intake/Output Summary (Last 24 hours) at 02/04/2023 0813 Last data filed at 02/04/2023 0752 Gross per 24 hour  Intake 2530 ml  Output 1910 ml  Net 620 ml     Intake/Output this shift: Total I/O In: 120 [P.O.:120] Out: -   Labs: Recent Labs    02/04/23 0418  HGB 10.7*   Recent Labs    02/04/23 0418  WBC 12.0*  RBC 3.54*  HCT 33.6*  PLT 239   Recent Labs    02/04/23 0418  NA 135  K 4.2  CL 101  CO2 26  BUN 21  CREATININE 1.06  GLUCOSE 162*  CALCIUM 8.7*   No results for input(s): "LABPT", "INR" in the last 72 hours.  Exam: General - Patient is Alert and Oriented Extremity - Neurologically intact Neurovascular intact Sensation intact distally Dorsiflexion/Plantar flexion intact Dressing - dressing C/D/I Motor Function - intact, moving foot and toes well on exam.   Past Medical History:  Diagnosis Date   Allergic rhinitis    Arthritis    Benign prostate hyperplasia    Chronic kidney disease    GERD (gastroesophageal reflux disease)    Hypertension    Tachycardia    Tremor     Assessment/Plan: 1 Day Post-Op Procedure(s) (LRB): TOTAL KNEE ARTHROPLASTY (Right) Principal Problem:   Osteoarthritis of right knee Active Problems:   Primary osteoarthritis  of right knee  Estimated body mass index is 28.29 kg/m as calculated from the following:   Height as of this encounter: 5\' 5"  (1.651 m).   Weight as of this encounter: 77.1 kg. Advance diet Up with therapy D/C IV fluids   Patient's anticipated LOS is less than 2 midnights, meeting these requirements: - Lives within 1 hour of care - Has a competent adult at home to recover with post-op recover - NO history of  - Chronic pain requiring opioids  - Diabetes  - Coronary Artery Disease  - Heart failure  - Heart attack  - Stroke  - DVT/VTE  - Cardiac arrhythmia  - Respiratory Failure/COPD  - Renal failure  - Anemia  - Advanced Liver disease  DVT Prophylaxis - Aspirin Weight bearing as tolerated. Continue therapy.  Plan is to go Home after hospital stay. Plan for discharge later today if progresses with therapy and meeting goals. Scheduled for OPPT at [facility]. Follow-up in the office in 2 weeks.  The PDMP database was reviewed today prior to any opioid medications being prescribed to this patient.  Arther Abbott, PA-C Orthopedic Surgery [phone] 02/04/2023, 8:13 AM

## 2023-02-04 NOTE — Progress Notes (Signed)
Physical Therapy Treatment Patient Details Name: Gerald Knapp. MRN: [id number] DOB: 1931-07-20 Today's Date: 02/04/2023   History of Present Illness Pt is 89 yo male admitted on 02/03/23 for R TKA.  Pt with hx including but not limited to OA, Parkinsons, GERD, HTN, hernia repair    PT Comments  Pt making progress with therapy today.  Considering age and comorbidity of Parkinson's he progressed well. He ambulated 8' x 2 with close chair follow due to knees buckling due to fatigue.  He has good pain control and reports R leg moving better than prior to surgery.  Continue to progress as able, needs further therapy prior to return home.       Assistance Recommended at Discharge Frequent or constant Supervision/Assistance  If plan is discharge home, recommend the following:  Can travel by private vehicle    A lot of help with walking and/or transfers;A lot of help with bathing/dressing/bathroom;Assistance with cooking/housework;Help with stairs or ramp for entrance      Equipment Recommendations  None recommended by PT    Recommendations for Other Services       Precautions / Restrictions Precautions Precautions: Fall;Knee Restrictions RLE Weight Bearing: Weight bearing as tolerated     Mobility  Bed Mobility               General bed mobility comments: slept in chair as he does at home    Transfers Overall transfer level: Needs assistance Equipment used: Rolling walker (2 wheels) Transfers: Sit to/from Stand Sit to Stand: Min assist           General transfer comment: cues for hand placement and R LE management; increased time to initiate and rise; performed x 2    Ambulation/Gait Ambulation/Gait assistance: Min assist, +2 safety/equipment Gait Distance (Feet): 8 Feet (8'x2) Assistive device: Rolling walker (2 wheels) Gait Pattern/deviations: Step-to pattern, Decreased stance time - right, Trunk flexed, Shuffle Gait velocity: decreased     General  Gait Details: Cues for posture, sequencing, and RW proximity. Had close chair follow.  After about 8' pt fatigues and knees begin to buckle requiring chair to be readily available.  REports feels like his left leg is "stepping into a hole."  Pt had severe genu varus on R LE prior to surgery and now is straight so discussed it is longer now b/c straight and that L leg is "stepping down" more possibly causing this feeling.   Stairs             Wheelchair Mobility     Tilt Bed    Modified Rankin (Stroke Patients Only)       Balance Overall balance assessment: Needs assistance Sitting-balance support: No upper extremity supported Sitting balance-Leahy Scale: Good     Standing balance support: Bilateral upper extremity supported, Reliant on assistive device for balance Standing balance-Leahy Scale: Poor Standing balance comment: RW and min A; tendency to posterior lean                            Cognition Arousal/Alertness: Awake/alert Behavior During Therapy: WFL for tasks assessed/performed Overall Cognitive Status: Within Functional Limits for tasks assessed                                 General Comments: Pt with hx of Parkinson's needing increased time to initiate mobility at times, but otherwise follows commands, oriented,  and whitty        Exercises Total Joint Exercises Ankle Circles/Pumps: AROM, Both, 10 reps, Supine Quad Sets: AROM, Both, 10 reps, Supine Heel Slides: AAROM, Right, 10 reps, Supine Hip ABduction/ADduction: AAROM, Right, 10 reps, Supine Long Arc Quad: AAROM, Right, 10 reps, Seated Knee Flexion: AAROM, Right, 10 reps, Seated Goniometric ROM: R knee ~5 degrees to 80 degrees Other Exercises Other Exercises: Pt tolerating all well and reports moving better than prior to surgery.  ENcouraged to perform ankle pumps and quad sets on his own throughout day bilaterally    General Comments        Pertinent Vitals/Pain Pain  Assessment Pain Assessment: No/denies pain    Home Living                          Prior Function            PT Goals (current goals can now be found in the care plan section) Progress towards PT goals: Progressing toward goals    Frequency    7X/week      PT Plan Current plan remains appropriate    Co-evaluation              AM-PAC PT "6 Clicks" Mobility   Outcome Measure  Help needed turning from your back to your side while in a flat bed without using bedrails?: A Little Help needed moving from lying on your back to sitting on the side of a flat bed without using bedrails?: A Lot Help needed moving to and from a bed to a chair (including a wheelchair)?: A Little Help needed standing up from a chair using your arms (e.g., wheelchair or bedside chair)?: A Little Help needed to walk in hospital room?: Total Help needed climbing 3-5 steps with a railing? : Total 6 Click Score: 13    End of Session Equipment Utilized During Treatment: Gait belt Activity Tolerance: Patient tolerated treatment well Patient left: in chair;with call bell/phone within reach;with chair alarm set;with family/visitor present Nurse Communication: Mobility status PT Visit Diagnosis: Other abnormalities of gait and mobility (R26.89);Unsteadiness on feet (R26.81);Muscle weakness (generalized) (M62.81)     Time: 6578-4696 PT Time Calculation (min) (ACUTE ONLY): 32 min  Charges:    $Gait Training: 8-22 mins $Therapeutic Exercise: 8-22 mins PT General Charges $$ ACUTE PT VISIT: 1 Visit                     Gerald Knapp, PT Acute Rehab Services [facility] [phone]    Rayetta Humphrey 02/04/2023, 11:15 AM

## 2023-02-05 ENCOUNTER — Other Ambulatory Visit: Payer: Self-pay | Admitting: Neurology

## 2023-02-05 DIAGNOSIS — Z79899 Other long term (current) drug therapy: Secondary | ICD-10-CM | POA: Diagnosis not present

## 2023-02-05 DIAGNOSIS — G20A2 Parkinson's disease without dyskinesia, with fluctuations: Secondary | ICD-10-CM

## 2023-02-05 DIAGNOSIS — M1711 Unilateral primary osteoarthritis, right knee: Secondary | ICD-10-CM | POA: Diagnosis not present

## 2023-02-05 DIAGNOSIS — Z7982 Long term (current) use of aspirin: Secondary | ICD-10-CM | POA: Diagnosis not present

## 2023-02-05 DIAGNOSIS — N189 Chronic kidney disease, unspecified: Secondary | ICD-10-CM | POA: Diagnosis not present

## 2023-02-05 DIAGNOSIS — I129 Hypertensive chronic kidney disease with stage 1 through stage 4 chronic kidney disease, or unspecified chronic kidney disease: Secondary | ICD-10-CM | POA: Diagnosis not present

## 2023-02-05 LAB — CBC
HCT: 29.4 % — ABNORMAL LOW (ref 39.0–52.0)
Hemoglobin: 9.5 g/dL — ABNORMAL LOW (ref 13.0–17.0)
MCH: 31.1 pg (ref 26.0–34.0)
MCHC: 32.3 g/dL (ref 30.0–36.0)
MCV: 96.4 fL (ref 80.0–100.0)
Platelets: 216 10*3/uL (ref 150–400)
RBC: 3.05 MIL/uL — ABNORMAL LOW (ref 4.22–5.81)
RDW: 13 % (ref 11.5–15.5)
WBC: 13 10*3/uL — ABNORMAL HIGH (ref 4.0–10.5)
nRBC: 0 % (ref 0.0–0.2)

## 2023-02-05 NOTE — Plan of Care (Signed)
  Problem: Education: Goal: Knowledge of the prescribed therapeutic regimen will improve Outcome: Progressing   Problem: Activity: Goal: Ability to avoid complications of mobility impairment will improve Outcome: Progressing Goal: Range of joint motion will improve Outcome: Adequate for Discharge   Problem: Clinical Measurements: Goal: Postoperative complications will be avoided or minimized Outcome: Progressing   Problem: Pain Management: Goal: Pain level will decrease with appropriate interventions Outcome: Adequate for Discharge   Problem: Skin Integrity: Goal: Will show signs of wound healing Outcome: Progressing   Problem: Education: Goal: Knowledge of General Education information will improve Description: Including pain rating scale, medication(s)/side effects and non-pharmacologic comfort measures Outcome: Progressing   Problem: Health Behavior/Discharge Planning: Goal: Ability to manage health-related needs will improve Outcome: Progressing   Problem: Clinical Measurements: Goal: Ability to maintain clinical measurements within normal limits will improve Outcome: Progressing Goal: Will remain free from infection Outcome: Progressing Goal: Diagnostic test results will improve Outcome: Progressing Goal: Respiratory complications will improve Outcome: Adequate for Discharge Goal: Cardiovascular complication will be avoided Outcome: Progressing   Problem: Activity: Goal: Risk for activity intolerance will decrease Outcome: Progressing   Problem: Coping: Goal: Level of anxiety will decrease Outcome: Progressing   Problem: Elimination: Goal: Will not experience complications related to bowel motility Outcome: Progressing Goal: Will not experience complications related to urinary retention Outcome: Adequate for Discharge   Problem: Pain Managment: Goal: General experience of comfort will improve Outcome: Progressing   Problem: Safety: Goal: Ability to  remain free from injury will improve Outcome: Progressing   Problem: Skin Integrity: Goal: Risk for impaired skin integrity will decrease Outcome: Adequate for Discharge

## 2023-02-05 NOTE — Progress Notes (Signed)
Physical Therapy Treatment Patient Details Name: Gerald Knapp. MRN: [id number] DOB: 07/11/1931 Today's Date: 02/05/2023   History of Present Illness Pt is 89 yo male admitted on 02/03/23 for R TKA.  Pt with hx including but not limited to OA, Parkinsons, GERD, HTN, hernia repair    PT Comments  Pt with increased pain today that limited progression.  He did report he had not been on his feet since yesterday afternoon around 14/1500 and felt stiff.  He required min-mod A for transfers, chair follow, and ambulated 30'.  With fatigue pt's knees begin to buckle.  Pt not able to ambulate household distance and remains fall risk - would continue to benefit from therapy.     Assistance Recommended at Discharge Frequent or constant Supervision/Assistance  If plan is discharge home, recommend the following:  Can travel by private vehicle    A lot of help with walking and/or transfers;A lot of help with bathing/dressing/bathroom;Assistance with cooking/housework;Help with stairs or ramp for entrance      Equipment Recommendations  None recommended by PT    Recommendations for Other Services       Precautions / Restrictions Precautions Precautions: Fall;Knee Restrictions RLE Weight Bearing: Weight bearing as tolerated     Mobility  Bed Mobility Overal bed mobility: Needs Assistance Bed Mobility: Sit to Supine       Sit to supine: Mod assist   General bed mobility comments: Assist for bil legs with HOB elevated; sleeps in chair at home    Transfers Overall transfer level: Needs assistance Equipment used: Rolling walker (2 wheels) Transfers: Sit to/from Stand Sit to Stand: Min assist           General transfer comment: Performed x 2; slow to rise, cues to turn completly prior to sitting (pt sat too soon requiring mod A to complete turn)    Ambulation/Gait Ambulation/Gait assistance: Min assist, +2 safety/equipment Gait Distance (Feet): 30 Feet (30' then  25') Assistive device: Rolling walker (2 wheels) Gait Pattern/deviations: Step-to pattern, Decreased stance time - right, Trunk flexed Gait velocity: decreased     General Gait Details: Pt reports has been ambulating at home with RW more forward and is more comfortable in that manner so he doesn't feel that he is falling backward -he did better in this manner yesterday so continued today.  Pt had more pain today that limited.  He additionally had not been on his feet since yesterday afternoon and was slower to start moving today.  Pt unsteady with the first few steps -taking smaller steps and cues to straighten knees, improved after ~6'.  Does still need chair follow as when he fatigues begins to buckle   Stairs             Wheelchair Mobility     Tilt Bed    Modified Rankin (Stroke Patients Only)       Balance Overall balance assessment: Needs assistance Sitting-balance support: No upper extremity supported Sitting balance-Leahy Scale: Good Sitting balance - Comments: tendency to lean posteriorly initially and with challenges   Standing balance support: Bilateral upper extremity supported, Reliant on assistive device for balance Standing balance-Leahy Scale: Poor Standing balance comment: RW and min A; tendency to posterior lean                            Cognition Arousal/Alertness: Awake/alert Behavior During Therapy: WFL for tasks assessed/performed Overall Cognitive Status: Within Functional Limits for tasks  assessed                                 General Comments: Pt with hx of Parkinson's needing increased time to initiate mobility at times, but otherwise follows commands, oriented, and whitty        Exercises Total Joint Exercises Ankle Circles/Pumps: AROM, Both, 10 reps, Supine Quad Sets: AROM, Both, 10 reps, Supine Heel Slides: AAROM, Right, 10 reps, Supine Hip ABduction/ADduction:  (held due to pain) Long Arc Quad:  (held due  to pain) Knee Flexion: AAROM, Right, 10 reps, Seated Goniometric ROM: R knee ~10 to 70 degrees Other Exercises Other Exercises: Pt with increased pain today so held LAQ and hip ABD and performed others with gentle ROM and assist as needed    General Comments        Pertinent Vitals/Pain Pain Assessment Pain Assessment: Faces Pain Score: 6  Faces Pain Scale: Hurts whole lot (varied during session, 2/10 rest, 4/10 walk, 8/10 lifting for transfers) Pain Location: R knee Pain Descriptors / Indicators: Discomfort, Sore, Sharp Pain Intervention(s): Limited activity within patient's tolerance, Monitored during session, Premedicated before session    Home Living                          Prior Function            PT Goals (current goals can now be found in the care plan section) Progress towards PT goals: Progressing toward goals    Frequency    7X/week      PT Plan Current plan remains appropriate    Co-evaluation              AM-PAC PT "6 Clicks" Mobility   Outcome Measure  Help needed turning from your back to your side while in a flat bed without using bedrails?: A Little Help needed moving from lying on your back to sitting on the side of a flat bed without using bedrails?: A Lot Help needed moving to and from a bed to a chair (including a wheelchair)?: A Little Help needed standing up from a chair using your arms (e.g., wheelchair or bedside chair)?: A Little Help needed to walk in hospital room?: A Lot Help needed climbing 3-5 steps with a railing? : Total 6 Click Score: 14    End of Session Equipment Utilized During Treatment: Gait belt Activity Tolerance: Patient limited by pain Patient left: in chair;with call bell/phone within reach;with chair alarm set;with family/visitor present Nurse Communication: Mobility status PT Visit Diagnosis: Other abnormalities of gait and mobility (R26.89);Unsteadiness on feet (R26.81);Muscle weakness  (generalized) (M62.81)     Time: 0454-0981 PT Time Calculation (min) (ACUTE ONLY): 27 min  Charges:    $Gait Training: 8-22 mins $Therapeutic Exercise: 8-22 mins PT General Charges $$ ACUTE PT VISIT: 1 Visit                     Anise Salvo, PT Acute Rehab Services [facility] [phone]    Rayetta Humphrey 02/05/2023, 12:30 PM

## 2023-02-05 NOTE — Progress Notes (Signed)
   Subjective: 2 Days Post-Op Procedure(s) (LRB): TOTAL KNEE ARTHROPLASTY (Right) Patient reports pain as mild.   Patient seen in rounds by Dr. Lequita Halt. Patient is doing well this AM. Was not meeting goals with therapy yesterday to qualify for discharge. Will continue working on mobility today, he had a terribly arthritic and unstable knee prior to surgery. Plan is to go Home after hospital stay.  Objective: Vital signs in last 24 hours: Temp:  [97.5 F (36.4 C)-98 F (36.7 C)] 97.5 F (36.4 C) (07/10 0617) Pulse Rate:  [71-75] 75 (07/09 2043) Resp:  [18] 18 (07/10 0617) BP: (115-122)/(48-72) 115/48 (07/10 0617) SpO2:  [96 %-98 %] 96 % (07/10 0617)  Intake/Output from previous day:  Intake/Output Summary (Last 24 hours) at 02/05/2023 0814 Last data filed at 02/05/2023 0430 Gross per 24 hour  Intake 860 ml  Output 545 ml  Net 315 ml    Intake/Output this shift: No intake/output data recorded.  Labs: Recent Labs    02/04/23 0418 02/05/23 0420  HGB 10.7* 9.5*   Recent Labs    02/04/23 0418 02/05/23 0420  WBC 12.0* 13.0*  RBC 3.54* 3.05*  HCT 33.6* 29.4*  PLT 239 216   Recent Labs    02/04/23 0418  NA 135  K 4.2  CL 101  CO2 26  BUN 21  CREATININE 1.06  GLUCOSE 162*  CALCIUM 8.7*   No results for input(s): "LABPT", "INR" in the last 72 hours.  Exam: General - Patient is Alert and Oriented Extremity - Neurologically intact Neurovascular intact Sensation intact distally Dorsiflexion/Plantar flexion intact Dressing/Incision - clean, dry, no drainage Motor Function - intact, moving foot and toes well on exam.   Past Medical History:  Diagnosis Date   Allergic rhinitis    Arthritis    Benign prostate hyperplasia    Chronic kidney disease    GERD (gastroesophageal reflux disease)    Hypertension    Tachycardia    Tremor     Assessment/Plan: 2 Days Post-Op Procedure(s) (LRB): TOTAL KNEE ARTHROPLASTY (Right) Principal Problem:   Osteoarthritis  of right knee Active Problems:   Primary osteoarthritis of right knee  Estimated body mass index is 28.29 kg/m as calculated from the following:   Height as of this encounter: 5\' 5"  (1.651 m).   Weight as of this encounter: 77.1 kg. Up with therapy  DVT Prophylaxis - Aspirin Weight-bearing as tolerated  Plan for discharge later today if meeting goals with therapy.  Arther Abbott, PA-C Orthopedic Surgery [phone] 02/05/2023, 8:14 AM

## 2023-02-05 NOTE — Progress Notes (Signed)
Physical Therapy Treatment Patient Details Name: Gerald Knapp. MRN: [id number] DOB: 1930/08/10 Today's Date: 02/05/2023   History of Present Illness Pt is 89 yo male admitted on 02/03/23 for R TKA.  Pt with hx including but not limited to OA, Parkinsons, GERD, HTN, hernia repair    PT Comments  Pt with good improvement and pain control this afternoon.  He does continue to move slowly (particularly with first transfers and steps) but was steady with no buckling and no posterior lean while using RW.  Pt was able to self monitor and request rest break when needed.  Pt ambulating 35'x2 with RW (does prefer more forward placement of RW) and min guard.  Overall, pt was min guard for transfers but occasionally needing min A - family comfortable in providing.  Wife and daughter, Gerald Knapp, present and educated on transfer techniques, assisting, and safety. Pt has all DME at home including a lift chair, ramp, and w/c if needed.  He also has excellent home support from multiple family members.  With improved pain control, mobility, DME, and good home support pt demonstrates mobility necessary to return home from PT perspective.  Pt, his wife, and daughter all agree.  If remains hospitalized will continue to benefit from acute PT services.      Assistance Recommended at Discharge Frequent or constant Supervision/Assistance  If plan is discharge home, recommend the following:  Can travel by private vehicle    Assistance with cooking/housework;Help with stairs or ramp for entrance;A little help with walking and/or transfers;A little help with bathing/dressing/bathroom      Equipment Recommendations  None recommended by PT    Recommendations for Other Services       Precautions / Restrictions Precautions Precautions: Fall;Knee Restrictions RLE Weight Bearing: Weight bearing as tolerated     Mobility  Bed Mobility Overal bed mobility: Needs Assistance Bed Mobility: Sit to Supine     Supine to  sit: Mod assist Sit to supine: Mod assist   General bed mobility comments: Assist for bil legs with HOB elevated; sleeps in chair at home    Transfers Overall transfer level: Needs assistance Equipment used: Rolling walker (2 wheels) Transfers: Sit to/from Stand Sit to Stand: Min assist, Min guard           General transfer comment: First stand from bed pt very slow to rise and requirng min A to steady when shifting R hand from bed to walker.  Stood 2 more times during session (bed and recliner) with improved speed and only min guard.  Had daughter, Gerald Knapp, assist with transfers -educated on gait belt and positioning    Ambulation/Gait Ambulation/Gait assistance: Min guard Gait Distance (Feet): 35 Feet (35'x2) Assistive device: Rolling walker (2 wheels) Gait Pattern/deviations: Step-to pattern, Decreased stance time - right, Trunk flexed Gait velocity: decreased     General Gait Details: Pt reports has been ambulating at home with RW more forward and is more comfortable in that manner so he doesn't feel that he is falling backward -he did better in this manner yesterday so continued today.  Improved pain control this afternoon and was able to ambulate 35'x2.  Again his first few steps (first bout) with very small step length, shuffle, and increased time but improves after about 5'.  Pt requesting rest break when needed and no buckling this afternoon   Stairs Stairs:  (has ramp - educated to use w/c on ramp)           Wheelchair Mobility  Tilt Bed    Modified Rankin (Stroke Patients Only)       Balance Overall balance assessment: Needs assistance Sitting-balance support: No upper extremity supported Sitting balance-Leahy Scale: Good Sitting balance - Comments: tendency to lean posteriorly initially and with challenges   Standing balance support: Bilateral upper extremity supported, Reliant on assistive device for balance Standing balance-Leahy Scale:  Poor Standing balance comment: RW and min guard, did not posterior lean                            Cognition Arousal/Alertness: Awake/alert Behavior During Therapy: WFL for tasks assessed/performed Overall Cognitive Status: Within Functional Limits for tasks assessed                                 General Comments: Pt with hx of Parkinson's needing increased time to initiate mobility at times, but otherwise follows commands, oriented, and whitty        Exercises Total Joint Exercises Ankle Circles/Pumps: AROM, Both, 10 reps, Supine Quad Sets: AROM, Both, 10 reps, Supine Heel Slides: AAROM, Right, Supine, 5 reps Hip ABduction/ADduction: AAROM, Right, 5 reps, Supine Long Arc Quad: AAROM, 5 reps, Right, Supine Knee Flexion: AAROM, Right, 10 reps, Seated Goniometric ROM: R knee ~5 to 70 degrees Other Exercises Other Exercises: Improved pain control this afternoon but still provided AAROM and limited some exercises to 5 reps to maintain pain control.  Educated on exercises to tolerance only    General Comments   Educated on safe ice use, no pivots, car transfers, resting with leg straight, and TED hose during day. Also, encouraged walking every 1-2 hours during day. Educated on HEP with focus on mobility the first weeks. Discussed doing exercises within pain control and if pain increasing could decreased ROM, reps, and stop exercises as needed. Encouraged to perform quad sets and ankle pumps frequently for blood flow and to promote full knee extension. Additional education, on self monitoring and rest breaks as needed.  Discussed having seats available to rest or having w/c nearby.  Pt reports toilet is <20 feet from his lift chair and will also have urinal available.  Pt having most difficulty with initial sit to stand and first few steps after resting - discussed frequent mobility every 1-2 hr (even if just standing or taking a couple steps) to promote recovery,  decrease risk of blood clots, and hopefull limit some of his stiffness.  Advised always have supervision with use of gait belt.  Had daughter Gerald Knapp assist with transfers and educated on positioning and use of gait belt. Pt and family comfortable with level of assist required and have no further questions.      Pertinent Vitals/Pain Pain Assessment Pain Assessment: Faces Pain Score: 6  Faces Pain Scale: Hurts a little bit (reports much better than am session) Pain Location: R knee Pain Descriptors / Indicators: Sore, Discomfort Pain Intervention(s): Limited activity within patient's tolerance, Monitored during session, Premedicated before session, Repositioned, Ice applied    Home Living                          Prior Function            PT Goals (current goals can now be found in the care plan section) Progress towards PT goals: Progressing toward goals    Frequency    7X/week  PT Plan Current plan remains appropriate    Co-evaluation              AM-PAC PT "6 Clicks" Mobility   Outcome Measure  Help needed turning from your back to your side while in a flat bed without using bedrails?: A Little Help needed moving from lying on your back to sitting on the side of a flat bed without using bedrails?: A Lot Help needed moving to and from a bed to a chair (including a wheelchair)?: A Little Help needed standing up from a chair using your arms (e.g., wheelchair or bedside chair)?: A Little Help needed to walk in hospital room?: A Little Help needed climbing 3-5 steps with a railing? : Total 6 Click Score: 15    End of Session Equipment Utilized During Treatment: Gait belt Activity Tolerance: Patient tolerated treatment well Patient left: in chair;with call bell/phone within reach;with chair alarm set;with family/visitor present Nurse Communication: Mobility status PT Visit Diagnosis: Other abnormalities of gait and mobility (R26.89);Unsteadiness on feet  (R26.81);Muscle weakness (generalized) (M62.81)     Time: 1610-9604 PT Time Calculation (min) (ACUTE ONLY): 51 min  Charges:    $Gait Training: 8-22 mins $Therapeutic Exercise: 8-22 mins $Therapeutic Activity: 8-22 mins PT General Charges $$ ACUTE PT VISIT: 1 Visit                     Anise Salvo, PT Acute Rehab [facility] [phone]    Rayetta Humphrey 02/05/2023, 3:31 PM

## 2023-02-06 DIAGNOSIS — Z96651 Presence of right artificial knee joint: Secondary | ICD-10-CM | POA: Diagnosis not present

## 2023-02-06 DIAGNOSIS — M6281 Muscle weakness (generalized): Secondary | ICD-10-CM | POA: Diagnosis not present

## 2023-02-06 DIAGNOSIS — R262 Difficulty in walking, not elsewhere classified: Secondary | ICD-10-CM | POA: Diagnosis not present

## 2023-02-06 DIAGNOSIS — Z471 Aftercare following joint replacement surgery: Secondary | ICD-10-CM | POA: Diagnosis not present

## 2023-02-10 NOTE — Discharge Summary (Signed)
Patient ID: Gerald Knapp. MRN: [id number] DOB/AGE: Sep 10, 1930 89 y.o.  Admit date: 02/03/2023 Discharge date: 02/05/2023  Admission Diagnoses:  Principal Problem:   Osteoarthritis of right knee Active Problems:   Primary osteoarthritis of right knee   Discharge Diagnoses:  Same  Past Medical History:  Diagnosis Date   Allergic rhinitis    Arthritis    Benign prostate hyperplasia    Chronic kidney disease    GERD (gastroesophageal reflux disease)    Hypertension    Tachycardia    Tremor     Surgeries: Procedure(s): TOTAL KNEE ARTHROPLASTY on 02/03/2023   Consultants:   Discharged Condition: Improved  Hospital Course: Aristeo Hankerson. is an 89 y.o. male who was admitted 02/03/2023 for operative treatment ofOsteoarthritis of right knee. Patient has severe unremitting pain that affects sleep, daily activities, and work/hobbies. After pre-op clearance the patient was taken to the operating room on 02/03/2023 and underwent  Procedure(s): TOTAL KNEE ARTHROPLASTY.    Patient was given perioperative antibiotics:  Anti-infectives (From admission, onward)    Start     Dose/Rate Route Frequency Ordered Stop   02/03/23 1900  ceFAZolin (ANCEF) IVPB 2g/100 mL premix        2 g 200 mL/hr over 30 Minutes Intravenous Every 6 hours 02/03/23 1637 02/04/23 0154   02/03/23 1000  ceFAZolin (ANCEF) IVPB 2g/100 mL premix        2 g 200 mL/hr over 30 Minutes Intravenous On call to O.R. 02/03/23 0955 02/03/23 1258        Patient was given sequential compression devices, early ambulation, and chemoprophylaxis to prevent DVT.  Patient benefited maximally from hospital stay and there were no complications.    Recent vital signs: No data found.   Recent laboratory studies: No results for input(s): "WBC", "HGB", "HCT", "PLT", "NA", "K", "CL", "CO2", "BUN", "CREATININE", "GLUCOSE", "INR", "CALCIUM" in the last 72 hours.  Invalid input(s): "PT", "2"   Discharge Medications:    Allergies as of 02/05/2023   No Known Allergies      Medication List     TAKE these medications    acetaminophen 650 MG CR tablet Commonly known as: TYLENOL Take 650 mg by mouth 2 (two) times daily as needed for pain.   aspirin 81 MG chewable tablet Chew 1 tablet (81 mg total) by mouth 2 (two) times daily for 20 days. Then resume one 81 mg aspirin once a day What changed:  when to take this additional instructions   carbidopa-levodopa 25-100 MG tablet Commonly known as: SINEMET IR Take 1 tablet by mouth 4 (four) times daily. Take at 8 AM, 12, 4 PM and 8 PM daily.   famotidine-calcium carbonate-magnesium hydroxide 10-800-165 MG chewable tablet Commonly known as: PEPCID COMPLETE Chew 1 tablet by mouth daily as needed (heartburn).   fluticasone 50 MCG/ACT nasal spray Commonly known as: FLONASE Place 1 spray into both nostrils daily.   loratadine 10 MG tablet Commonly known as: CLARITIN Take 10 mg by mouth daily.   methocarbamol 500 MG tablet Commonly known as: ROBAXIN Take 1 tablet (500 mg total) by mouth every 6 (six) hours as needed for muscle spasms.   metoprolol tartrate 25 MG tablet Commonly known as: LOPRESSOR TAKE 1/2 TABLET TWICE DAILY   ondansetron 4 MG tablet Commonly known as: ZOFRAN Take 1 tablet (4 mg total) by mouth every 6 (six) hours as needed for nausea.   oxyCODONE 5 MG immediate release tablet Commonly known as: Oxy IR/ROXICODONE Take 1-2 tablets (  5-10 mg total) by mouth every 6 (six) hours as needed for severe pain.   polyvinyl alcohol 1.4 % ophthalmic solution Commonly known as: LIQUIFILM TEARS Place 1 drop into both eyes daily as needed for dry eyes.   traMADol 50 MG tablet Commonly known as: ULTRAM Take 1-2 tablets (50-100 mg total) by mouth every 6 (six) hours as needed for moderate pain.               Discharge Care Instructions  (From admission, onward)           Start     Ordered   02/04/23 0000  Weight bearing as  tolerated        02/04/23 0815   02/04/23 0000  Change dressing       Comments: You may remove the bulky bandage (ACE wrap and gauze) two days after surgery. You will have an adhesive waterproof bandage underneath. Leave this in place until your first follow-up appointment.   02/04/23 0815            Diagnostic Studies: No results found.  Disposition: Discharge disposition: 01-Home or Self Care       Discharge Instructions     Call MD / Call 911   Complete by: As directed    If you experience chest pain or shortness of breath, CALL 911 and be transported to the hospital emergency room.  If you develope a fever above 101 F, pus (white drainage) or increased drainage or redness at the wound, or calf pain, call your surgeon's office.   Change dressing   Complete by: As directed    You may remove the bulky bandage (ACE wrap and gauze) two days after surgery. You will have an adhesive waterproof bandage underneath. Leave this in place until your first follow-up appointment.   Constipation Prevention   Complete by: As directed    Drink plenty of fluids.  Prune juice may be helpful.  You may use a stool softener, such as Colace (over the counter) 100 mg twice a day.  Use MiraLax (over the counter) for constipation as needed.   Diet - low sodium heart healthy   Complete by: As directed    Do not put a pillow under the knee. Place it under the heel.   Complete by: As directed    Driving restrictions   Complete by: As directed    No driving for two weeks   Post-operative opioid taper instructions:   Complete by: As directed    POST-OPERATIVE OPIOID TAPER INSTRUCTIONS: It is important to wean off of your opioid medication as soon as possible. If you do not need pain medication after your surgery it is ok to stop day one. Opioids include: Codeine, Hydrocodone(Norco, Vicodin), Oxycodone(Percocet, oxycontin) and hydromorphone amongst others.  Long term and even short term use of opiods  can cause: Increased pain response Dependence Constipation Depression Respiratory depression And more.  Withdrawal symptoms can include Flu like symptoms Nausea, vomiting And more Techniques to manage these symptoms Hydrate well Eat regular healthy meals Stay active Use relaxation techniques(deep breathing, meditating, yoga) Do Not substitute Alcohol to help with tapering If you have been on opioids for less than two weeks and do not have pain than it is ok to stop all together.  Plan to wean off of opioids This plan should start within one week post op of your joint replacement. Maintain the same interval or time between taking each dose and first decrease the dose.  Cut the total daily intake of opioids by one tablet each day Next start to increase the time between doses. The last dose that should be eliminated is the evening dose.      TED hose   Complete by: As directed    Use stockings (TED hose) for three weeks on both leg(s).  You may remove them at night for sleeping.   Weight bearing as tolerated   Complete by: As directed         Follow-up Information     Ollen Gross, MD. Go on 02/18/2023.   Specialty: Orthopedic Surgery Why: You are scheduled for first post op appt on Tuesday July 23 at 3:00pm. Contact information: [address] [place] [address] [phone]                  Signed: Arther Abbott 02/10/2023, 7:12 AM

## 2023-02-11 DIAGNOSIS — Z471 Aftercare following joint replacement surgery: Secondary | ICD-10-CM | POA: Diagnosis not present

## 2023-02-11 DIAGNOSIS — R262 Difficulty in walking, not elsewhere classified: Secondary | ICD-10-CM | POA: Diagnosis not present

## 2023-02-11 DIAGNOSIS — Z96651 Presence of right artificial knee joint: Secondary | ICD-10-CM | POA: Diagnosis not present

## 2023-02-11 DIAGNOSIS — M6281 Muscle weakness (generalized): Secondary | ICD-10-CM | POA: Diagnosis not present

## 2023-02-12 ENCOUNTER — Ambulatory Visit: Payer: Medicare Other | Admitting: Family Medicine

## 2023-02-12 DIAGNOSIS — M6281 Muscle weakness (generalized): Secondary | ICD-10-CM | POA: Diagnosis not present

## 2023-02-12 DIAGNOSIS — Z471 Aftercare following joint replacement surgery: Secondary | ICD-10-CM | POA: Diagnosis not present

## 2023-02-12 DIAGNOSIS — R262 Difficulty in walking, not elsewhere classified: Secondary | ICD-10-CM | POA: Diagnosis not present

## 2023-02-12 DIAGNOSIS — Z96651 Presence of right artificial knee joint: Secondary | ICD-10-CM | POA: Diagnosis not present

## 2023-02-14 DIAGNOSIS — M6281 Muscle weakness (generalized): Secondary | ICD-10-CM | POA: Diagnosis not present

## 2023-02-14 DIAGNOSIS — R262 Difficulty in walking, not elsewhere classified: Secondary | ICD-10-CM | POA: Diagnosis not present

## 2023-02-14 DIAGNOSIS — Z96651 Presence of right artificial knee joint: Secondary | ICD-10-CM | POA: Diagnosis not present

## 2023-02-14 DIAGNOSIS — Z471 Aftercare following joint replacement surgery: Secondary | ICD-10-CM | POA: Diagnosis not present

## 2023-02-18 DIAGNOSIS — R262 Difficulty in walking, not elsewhere classified: Secondary | ICD-10-CM | POA: Diagnosis not present

## 2023-02-18 DIAGNOSIS — Z96651 Presence of right artificial knee joint: Secondary | ICD-10-CM | POA: Diagnosis not present

## 2023-02-18 DIAGNOSIS — Z471 Aftercare following joint replacement surgery: Secondary | ICD-10-CM | POA: Diagnosis not present

## 2023-02-18 DIAGNOSIS — M6281 Muscle weakness (generalized): Secondary | ICD-10-CM | POA: Diagnosis not present

## 2023-02-20 DIAGNOSIS — R262 Difficulty in walking, not elsewhere classified: Secondary | ICD-10-CM | POA: Diagnosis not present

## 2023-02-20 DIAGNOSIS — Z96651 Presence of right artificial knee joint: Secondary | ICD-10-CM | POA: Diagnosis not present

## 2023-02-20 DIAGNOSIS — Z471 Aftercare following joint replacement surgery: Secondary | ICD-10-CM | POA: Diagnosis not present

## 2023-02-20 DIAGNOSIS — M6281 Muscle weakness (generalized): Secondary | ICD-10-CM | POA: Diagnosis not present

## 2023-02-25 DIAGNOSIS — Z96651 Presence of right artificial knee joint: Secondary | ICD-10-CM | POA: Diagnosis not present

## 2023-02-25 DIAGNOSIS — M6281 Muscle weakness (generalized): Secondary | ICD-10-CM | POA: Diagnosis not present

## 2023-02-25 DIAGNOSIS — Z471 Aftercare following joint replacement surgery: Secondary | ICD-10-CM | POA: Diagnosis not present

## 2023-02-25 DIAGNOSIS — R262 Difficulty in walking, not elsewhere classified: Secondary | ICD-10-CM | POA: Diagnosis not present

## 2023-02-27 DIAGNOSIS — R262 Difficulty in walking, not elsewhere classified: Secondary | ICD-10-CM | POA: Diagnosis not present

## 2023-02-27 DIAGNOSIS — Z471 Aftercare following joint replacement surgery: Secondary | ICD-10-CM | POA: Diagnosis not present

## 2023-02-27 DIAGNOSIS — M6281 Muscle weakness (generalized): Secondary | ICD-10-CM | POA: Diagnosis not present

## 2023-02-27 DIAGNOSIS — Z96651 Presence of right artificial knee joint: Secondary | ICD-10-CM | POA: Diagnosis not present

## 2023-03-04 DIAGNOSIS — R262 Difficulty in walking, not elsewhere classified: Secondary | ICD-10-CM | POA: Diagnosis not present

## 2023-03-04 DIAGNOSIS — M6281 Muscle weakness (generalized): Secondary | ICD-10-CM | POA: Diagnosis not present

## 2023-03-04 DIAGNOSIS — Z471 Aftercare following joint replacement surgery: Secondary | ICD-10-CM | POA: Diagnosis not present

## 2023-03-04 DIAGNOSIS — Z96651 Presence of right artificial knee joint: Secondary | ICD-10-CM | POA: Diagnosis not present

## 2023-03-06 DIAGNOSIS — Z96651 Presence of right artificial knee joint: Secondary | ICD-10-CM | POA: Diagnosis not present

## 2023-03-06 DIAGNOSIS — Z471 Aftercare following joint replacement surgery: Secondary | ICD-10-CM | POA: Diagnosis not present

## 2023-03-06 DIAGNOSIS — R262 Difficulty in walking, not elsewhere classified: Secondary | ICD-10-CM | POA: Diagnosis not present

## 2023-03-06 DIAGNOSIS — M6281 Muscle weakness (generalized): Secondary | ICD-10-CM | POA: Diagnosis not present

## 2023-03-10 ENCOUNTER — Telehealth: Payer: Medicare Other

## 2023-03-10 DIAGNOSIS — Z471 Aftercare following joint replacement surgery: Secondary | ICD-10-CM | POA: Diagnosis not present

## 2023-03-10 DIAGNOSIS — R262 Difficulty in walking, not elsewhere classified: Secondary | ICD-10-CM | POA: Diagnosis not present

## 2023-03-10 DIAGNOSIS — Z96651 Presence of right artificial knee joint: Secondary | ICD-10-CM | POA: Diagnosis not present

## 2023-03-10 DIAGNOSIS — M6281 Muscle weakness (generalized): Secondary | ICD-10-CM | POA: Diagnosis not present

## 2023-03-11 ENCOUNTER — Encounter: Payer: Self-pay | Admitting: Family Medicine

## 2023-03-11 ENCOUNTER — Ambulatory Visit: Payer: Medicare Other | Admitting: Family Medicine

## 2023-03-11 VITALS — BP 146/76 | HR 61 | Ht 66.0 in | Wt 176.5 lb

## 2023-03-11 DIAGNOSIS — G20A2 Parkinson's disease without dyskinesia, with fluctuations: Secondary | ICD-10-CM

## 2023-03-11 MED ORDER — CARBIDOPA-LEVODOPA 25-100 MG PO TABS
1.0000 | ORAL_TABLET | Freq: Four times a day (QID) | ORAL | 1 refills | Status: DC
Start: 2023-03-11 — End: 2023-09-10

## 2023-03-11 MED ORDER — CARBIDOPA-LEVODOPA ER 50-200 MG PO TBCR
1.0000 | EXTENDED_RELEASE_TABLET | Freq: Every day | ORAL | 1 refills | Status: DC
Start: 2023-03-11 — End: 2023-09-10

## 2023-03-11 NOTE — Progress Notes (Signed)
PATIENT: Gerald Knapp. DOB: 1931/07/13  REASON FOR VISIT: follow up HISTORY FROM: patient  Chief Complaint  Patient presents with   folllow up     Pt in room 1, wife in room. Here for Parkinson follow up. Pt said tremor seems to be worse at night when trying to sleep.     HISTORY OF PRESENT ILLNESS:  03/11/2023 ALL: Gerald Knapp returns for follow up for right sided PD. He was last seen by Gerald Knapp and doing well 07/2022. He continues carb-levo IR 1 tablet four times daily, usually around 8a, 12a, 6-7p and 8-9p. He continues carb-levo CR 1 tablet at 8p. He feels he is doing pretty well. He has noticed more of a tremor at bedtime. He is not overly bothered by it but does lie awake at times. He feels he is resting well. He usually sleeps about 9 hours. He is s/p right TKR 01/2023. He continues to work with PT. He is ambulating with walker. No falls. Appetite is good. He continues to drink Ensure daily. He denies difficulty swallowing.   07/30/2022 SA: Gerald. Knapp is an 89 year old right-handed gentleman with an underlying medical history of reflux disease, lumbar spinal stenosis with chronic low back pain, allergic rhinitis, and BPH, who presents for follow-up consultation of his right-sided parkinsonism.  The patient is accompanied by his wife again today.  I last saw him on 11/17/2019, at which time he reported feeling better on Sinemet.  He was taking 1 pill 3 times daily.  His dizziness improved after stopping terazosin.   He saw Gerald Dapper, NP on 03/23/2020.  He saw  on 09/26/2020 and then again on 04/04/2021.  Most recently, he saw Gerald Dapper, NP on 10/03/2021.   His Sinemet IR was increased to 1 pill 4 times daily over time and Sinemet CR was added as well.   Today, 07/30/2022: He reports doing fairly well.  He has had a couple of stumbles.  He has right knee pain and gets 3 monthly injections under Gerald. Wynn Knapp.  He has not actually fallen.  He uses a cane when he goes outside or when he  uses the bathroom at night, during the day he may not use the cane.  He feels stable cognitively.  Sinemet IR is 1 pill 4 times a day, he generally takes 1 at 8 AM, second dose around 12:15 PM or 12:30 PM, third dose around 6:30 PM or 7 PM and last dose around 9:30 PM or 10 PM.  He takes the ER around dinnertime along with IR.  10/03/2021 ALL: Gerald Knapp returns for follow up for PD. He continues generic Sinemet 25-100mg  QID. He continues to tolerate it well. Right hand tremor waxes and wanes. He denies any changes in gait or falls. He uses a cane for support. 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 He does endorse having occasional dizzy spells. Frequency is about once per month. He endorses feeling that heart starts to race. He is followed by cardiology. 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 He was started on metoprolol tartrate 12.5mg  BID. He denies difficulty swallowing. Appetite is decreased. Weight has been relatively stable. (10 lbs lost over 2 years). He does drink Ensure. Memory is good.   He is also being followed by Gerald Gerald Knapp for right knee pain d/t osteoarthritis, lumbar spinal stenosis, severe, with multilevel neurogenic claudication and right shoulder pain. He feels pain is fairly well managed.   04/04/2021  ALL:  Gerald Knapp returns for follow up for Parkinson's Disease. He continues generic Sinemet 25-100mg  QID (8am, 12pm, 6:30pm, 10pm). We added Sinemet CR 50-200mg  at bedtime at last visit due to concerns of worsening tremor at night. He does feel that it helped. He is not bothered by tremor at night. He does note waxing and waning tremor through the day but is not overly bothered by this.   Gait is stable. He did have one fall in his garden a few weeks ago while picking tomatoes. He reports leaning forward and losing his balance. No injuries. He feels that he is doing fairly well staying active. 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 He gets tired but does not feel overly  fatigued or short of breath.   Swallowing seems better, rare episodes of coughing. His appetite has decreased. He thinks this is due to having more knee pain. He is working with orthopedics who is recommending knee replacement.   He was seen by PCP in 10/2020, after our last visit. He reports that PCP also heard an irregular heart rhythm. EKG was reportedly normal. BP is usually 140's/70's. He feels it is elevated, today, due to traveling 29/70 in traffic. No palpitations, excessive fatigue, dizziness, shortness of breath or stroke like symptoms.   09/26/2020 ALL:  He returns for follow up on Parkinson's Disease. He is doing well. He continues carbidopa levodopa 25/100mg  QID. He feels that tremor is failry well managed during the day. Right hand tremor seems to be worse just before bedtime. He has a hard time getting to sleep due to tremor. He is sleeping well. He continues to be very careful with eating and drinking. He feels that as long as he eats slowly and is careful with liquids, he does not get choked. He continues to drink a couple of sips of amaretto that seems to help. He was recently seen by PCP following Covid diagnosis in January. He has recovered nicely. He reports PCP noticed an irregular heart rate at follow up. EKG was unremarkable per patient. He does have occasional weakness. He has had some intermittent dizziness over the past month. Uncertain if related to Covid.  No falls. Gait is stable. He continues to use cane.   03/23/2020 ALL:  Gerald Knapp. is a 89 y.o. male here today for follow up for Parkinson's Disease. He continues Sinemet 1 tablet TID. Tremor has worsened slightly over the past 4 months. He feels tremor and tension more in right forearm. He is tolerating Sinemet well with no adverse effects noted. He does continue to notice more saliva production and feels he gets strangled easily. He denies any difficulty eating or swallowing foods/liquids. He feels that he has more  drainage from seasonal allergies and that he gets a little strangled on drainage and saliva. He will sip on a glass of amaretto throughout the day and this prevents him from getting strangled. He feels gait is stable. 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 He did get tripped up in his tomato patch and fell over. Fortunately, no injuries. He uses a walking stick or cane everywhere he goes. He feels that he is eating well but has lost about 11 pounds since last being seen. He feels this is related to his wife no longer cooking full meals like she used to. His wife reports that he has had a decreased appetite recently. He is drinking a high protein Boost drink every day.  HISTORY: (copied from Gerald Teofilo Pod note on 11/17/2019)  Gerald. Rivett is an 89 year old right-handed gentleman with an underlying medical history of reflux disease, lumbar spinal stenosis with chronic low back pain, allergic rhinitis, and BPH, who presents for follow-up consultation of his right-sided parkinsonism.  The patient is accompanied by his wife again today.  I first met him on 08/19/2019 at the request of his primary care physician, at which time he reported a 2 to 31-month history of tremor affecting his right more than left hand.  His exam was in keeping with parkinsonism with right-sided lateralization noted.  He was advised to start a trial of low-dose Sinemet with gradual titration.   His wife called in the interim in February 2021 reporting that he had chest pains and dizziness.  They were not sure if these symptoms came from taking Terazosin.  He was advised to follow-up with primary care physician or proceed to the emergency room should he have chest pain.     Today, 11/17/2019: He reports feeling better with regards to his tremor, he is able to tolerate Sinemet, 1 pill 3 times daily, first pill around 7, second pill between 2 and 3 and last pill around 10 PM.  He feels that his tremor is just a little bit  better and his wife endorses that when he feeds himself he does not shake quite as much.  When he stopped the Terazosin his dizziness improved and he was able to take the Sinemet all along.  He has intermittent constipation, takes a stool softener on a daily basis.  He does not drink a whole lot of water, likes to drink tea or soda. No falls.    The patient's allergies, current medications, family history, past medical history, past social history, past surgical history and problem list were reviewed and updated as appropriate.    Previously:    08/19/19: (He) reports a recent onset right more than left hand tremor, started in or around November 2020.  He reports it is primarily on the right side and happens primarily when he is sitting or when walking.  It does not seem to as noticeable when he holds something.  He does have some fine motor dyscontrol.  He has noticed difficulty swallowing at times, feels like he chokes on his own saliva even at times.  The choking-like sensation happens with liquids and solids.  He sleeps fairly well, he sleeps in a lift chair for the past year because of his low back pain.  He has seen Gerald. Shelle Iron for this, he has also had steroid injections under Gerald. Ethelene Hal, about 3 or 4 altogether with limited success.  He is still active on his farm, they raise cows.  His daughter and son-in-law help on the farm, they stay on the farm.  He has no family history of tremor or Parkinson's disease.  Upon further asking, his wife has noticed change in his walking, he tends to shuffle at times and she has also noticed a more stooped posture.  He walks with a walking stick when he is outside on the farm.  He still uses his tractor and feeds the cattle.  Thankfully, he has not fallen.    REVIEW OF SYSTEMS: Out of a complete 14 system review of symptoms, the patient complains only of the following symptoms, tremor, and all other reviewed systems are negative.   ALLERGIES: No Known  Allergies  HOME MEDICATIONS: Outpatient Medications Prior to Visit  Medication Sig Dispense Refill  acetaminophen (TYLENOL) 650 MG CR tablet Take 650 mg by mouth 2 (two) times daily as needed for pain.     carbidopa-levodopa (SINEMET CR) 50-200 MG tablet Take 1 tablet by mouth at bedtime. 90 tablet 0   carbidopa-levodopa (SINEMET IR) 25-100 MG tablet Take 1 tablet by mouth 4 (four) times daily. Take at 8 AM, 12, 4 PM and 8 PM daily. 360 tablet 2   famotidine-calcium carbonate-magnesium hydroxide (PEPCID COMPLETE) 10-800-165 MG chewable tablet Chew 1 tablet by mouth daily as needed (heartburn).     fluticasone (FLONASE) 50 MCG/ACT nasal spray Place 1 spray into both nostrils daily.     loratadine (CLARITIN) 10 MG tablet Take 10 mg by mouth daily.     metoprolol tartrate (LOPRESSOR) 25 MG tablet TAKE 1/2 TABLET TWICE DAILY 90 tablet 3   ondansetron (ZOFRAN) 4 MG tablet Take 1 tablet (4 mg total) by mouth every 6 (six) hours as needed for nausea. 20 tablet 0   polyvinyl alcohol (LIQUIFILM TEARS) 1.4 % ophthalmic solution Place 1 drop into both eyes daily as needed for dry eyes.     methocarbamol (ROBAXIN) 500 MG tablet Take 1 tablet (500 mg total) by mouth every 6 (six) hours as needed for muscle spasms. (Patient not taking: Reported on 03/11/2023) 40 tablet 0   oxyCODONE (OXY IR/ROXICODONE) 5 MG immediate release tablet Take 1-2 tablets (5-10 mg total) by mouth every 6 (six) hours as needed for severe pain. (Patient not taking: Reported on 03/11/2023) 42 tablet 0   traMADol (ULTRAM) 50 MG tablet Take 1-2 tablets (50-100 mg total) by mouth every 6 (six) hours as needed for moderate pain. (Patient not taking: Reported on 03/11/2023) 40 tablet 0   No facility-administered medications prior to visit.    PAST MEDICAL HISTORY: Past Medical History:  Diagnosis Date   Allergic rhinitis    Arthritis    Benign prostate hyperplasia    Chronic kidney disease    GERD (gastroesophageal reflux disease)     Hypertension    Tachycardia    Tremor     PAST SURGICAL HISTORY: Past Surgical History:  Procedure Laterality Date   CARDIAC CATHETERIZATION     CATARACT EXTRACTION     COLONOSCOPY     HERNIA REPAIR     NOSE SURGERY     TOTAL KNEE ARTHROPLASTY Right 02/03/2023   Procedure: TOTAL KNEE ARTHROPLASTY;  Surgeon: Ollen Gross, MD;  Location: [facility];  Service: Orthopedics;  Laterality: Right;    FAMILY HISTORY: Family History  Problem Relation Age of Onset   Arthritis Mother    Hypertension Mother    Cancer Father    Diabetes Father    Arthritis Father     SOCIAL HISTORY: Social History   Socioeconomic History   Marital status: Married    Spouse name: Management consultant    Number of children: Not on file   Years of education: Not on file   Highest education level: Not on file  Occupational History   Not on file  Tobacco Use   Smoking status: Never   Smokeless tobacco: Never  Vaping Use   Vaping status: Never Used  Substance and Sexual Activity   Alcohol use: Yes    Comment: rarely a drink   Drug use: Never   Sexual activity: Not on file  Other Topics Concern   Not on file  Social History Narrative   Lives on a farm with his wife. Daughter and son-in-law live on the same farm in a separate  house   Right handed   Social Determinants of Health   Financial Resource Strain: Not on file  Food Insecurity: No Food Insecurity (02/03/2023)   Hunger Vital Sign    Worried About Running Out of Food in the Last Year: Never true    Ran Out of Food in the Last Year: Never true  Transportation Needs: No Transportation Needs (02/03/2023)   PRAPARE - Administrator, Civil Service (Medical): No    Lack of Transportation (Non-Medical): No  Physical Activity: Not on file  Stress: Not on file  Social Connections: Not on file  Intimate Partner Violence: Not At Risk (02/03/2023)   Humiliation, Afraid, Rape, and Kick questionnaire    Fear of Current or Ex-Partner: No    Emotionally  Abused: No    Physically Abused: No    Sexually Abused: No      PHYSICAL EXAM  Vitals:   03/11/23 1352 03/11/23 1358  BP: (!) 155/78 (!) 146/76  Pulse: 66 61  Weight: 176 lb 8 oz (80.1 kg)   Height: 5\' 6"  (1.676 m)      Body mass index is 28.49 kg/m.  Generalized: Well developed, in no acute distress  Cardiology: irregular rhythm, rate normal, no murmur noted Respiratory: clear to auscultation bilaterally  Neurological examination  Mentation: Alert oriented to time, place, history taking. Follows all commands speech and language fluent Cranial nerve II-XII: Pupils were equal round reactive to light. Extraocular movements were full, visual field were full on confrontational test. Facial sensation and strength were normal. Uvula tongue midline. Head turning and shoulder shrug  were normal and symmetric. Motor: The motor testing reveals 5 over 5 strength of all 4 extremities. Good symmetric motor tone is noted throughout. Resting tremor noted of right hand  Sensory: Sensory testing is intact to soft touch on all 4 extremities. No evidence of extinction is noted.  Coordination: Cerebellar testing reveals good finger-nose-finger and heel-to-shin bilaterally. Rapid alternating movements were smooth. Gait and station: Stooped posture, Gait is short and slow, stable with walker, unable to tandem Reflexes: Deep tendon reflexes are symmetric and normal bilaterally.   DIAGNOSTIC DATA (LABS, IMAGING, TESTING) - I reviewed patient records, labs, notes, testing and imaging myself where available.      No data to display           Lab Results  Component Value Date   WBC 13.0 (H) 02/05/2023   HGB 9.5 (L) 02/05/2023   HCT 29.4 (L) 02/05/2023   MCV 96.4 02/05/2023   PLT 216 02/05/2023      Component Value Date/Time   NA 135 02/04/2023 0418   K 4.2 02/04/2023 0418   CL 101 02/04/2023 0418   CO2 26 02/04/2023 0418   GLUCOSE 162 (H) 02/04/2023 0418   BUN 21 02/04/2023 0418    CREATININE 1.06 02/04/2023 0418   CALCIUM 8.7 (L) 02/04/2023 0418   GFRNONAA >60 02/04/2023 0418   No results found for: "CHOL", "HDL", "LDLCALC", "LDLDIRECT", "TRIG", "CHOLHDL" No results found for: "HGBA1C" No results found for: "VITAMINB12" No results found for: "TSH"     ASSESSMENT AND PLAN 89 y.o. year old male  has a past medical history of Allergic rhinitis, Arthritis, Benign prostate hyperplasia, Chronic kidney disease, GERD (gastroesophageal reflux disease), Hypertension, Tachycardia, and Tremor. here with     ICD-10-CM   1. Parkinson's disease without dyskinesia, with fluctuating manifestations  G20.A2         Gerald Knapp is doing well, overall. He continues  to tolerate generic Sinemet. We will continue 25-100mg  QID and 50-200mg  CR dosing at bedtime. I have advised he try taking IR dosing at 8a, 12a, 5p and 8p with CR dosing at 5-6p. I am hopeful this will help with worsening tremor at night. He will continue to follow cardiology, ortho and PCP.  He will continue regular activity. Fall precautions reviewed. He will focus on healthy lifestyle habits. He will follow up with me in 6 months, sooner if needed.    No orders of the defined types were placed in this encounter.     No orders of the defined types were placed in this encounter.    I spent 36 minutes of face-to-face and non-face-to-face time with patient.  This included previsit chart review, lab review, study review, order entry, electronic health record documentation, patient education.    Gerald Dapper, FNP-C 03/11/2023, 2:41 PM [facility] [address], [address] [place], [place] [address] [phone]

## 2023-03-11 NOTE — Patient Instructions (Signed)
Below is our plan:  We will continue current plan. Try taking carb-levo IR at 8am, 12pm, 4-5pm and 8pm with carb-levo CR at 5-6p. See if this may help settle down evening tremor.   Please make sure you are staying well hydrated. I recommend 50-60 ounces daily. Well balanced diet and regular exercise encouraged. Consistent sleep schedule with 6-8 hours recommended.   Please continue follow up with care team as directed.   Follow up with me in 6 months   You may receive a survey regarding today's visit. I encourage you to leave honest feed back as I do use this information to improve patient care. Thank you for seeing me today!

## 2023-03-12 DIAGNOSIS — L570 Actinic keratosis: Secondary | ICD-10-CM | POA: Diagnosis not present

## 2023-03-12 DIAGNOSIS — D485 Neoplasm of uncertain behavior of skin: Secondary | ICD-10-CM | POA: Diagnosis not present

## 2023-03-12 DIAGNOSIS — Z85828 Personal history of other malignant neoplasm of skin: Secondary | ICD-10-CM | POA: Diagnosis not present

## 2023-03-12 DIAGNOSIS — Z1283 Encounter for screening for malignant neoplasm of skin: Secondary | ICD-10-CM | POA: Diagnosis not present

## 2023-03-13 ENCOUNTER — Ambulatory Visit: Payer: Medicare Other | Admitting: Physical Medicine & Rehabilitation

## 2023-03-14 DIAGNOSIS — Z96651 Presence of right artificial knee joint: Secondary | ICD-10-CM | POA: Diagnosis not present

## 2023-03-14 DIAGNOSIS — M6281 Muscle weakness (generalized): Secondary | ICD-10-CM | POA: Diagnosis not present

## 2023-03-14 DIAGNOSIS — R262 Difficulty in walking, not elsewhere classified: Secondary | ICD-10-CM | POA: Diagnosis not present

## 2023-03-14 DIAGNOSIS — Z471 Aftercare following joint replacement surgery: Secondary | ICD-10-CM | POA: Diagnosis not present

## 2023-03-18 DIAGNOSIS — M6281 Muscle weakness (generalized): Secondary | ICD-10-CM | POA: Diagnosis not present

## 2023-03-18 DIAGNOSIS — Z96651 Presence of right artificial knee joint: Secondary | ICD-10-CM | POA: Diagnosis not present

## 2023-03-18 DIAGNOSIS — Z471 Aftercare following joint replacement surgery: Secondary | ICD-10-CM | POA: Diagnosis not present

## 2023-03-18 DIAGNOSIS — R262 Difficulty in walking, not elsewhere classified: Secondary | ICD-10-CM | POA: Diagnosis not present

## 2023-03-18 DIAGNOSIS — Z5189 Encounter for other specified aftercare: Secondary | ICD-10-CM | POA: Diagnosis not present

## 2023-03-25 ENCOUNTER — Ambulatory Visit: Payer: Medicare Other | Admitting: Family Medicine

## 2023-03-26 DIAGNOSIS — Z471 Aftercare following joint replacement surgery: Secondary | ICD-10-CM | POA: Diagnosis not present

## 2023-03-26 DIAGNOSIS — Z96651 Presence of right artificial knee joint: Secondary | ICD-10-CM | POA: Diagnosis not present

## 2023-03-26 DIAGNOSIS — M6281 Muscle weakness (generalized): Secondary | ICD-10-CM | POA: Diagnosis not present

## 2023-03-26 DIAGNOSIS — R262 Difficulty in walking, not elsewhere classified: Secondary | ICD-10-CM | POA: Diagnosis not present

## 2023-03-28 DIAGNOSIS — R262 Difficulty in walking, not elsewhere classified: Secondary | ICD-10-CM | POA: Diagnosis not present

## 2023-03-28 DIAGNOSIS — Z96651 Presence of right artificial knee joint: Secondary | ICD-10-CM | POA: Diagnosis not present

## 2023-03-28 DIAGNOSIS — Z471 Aftercare following joint replacement surgery: Secondary | ICD-10-CM | POA: Diagnosis not present

## 2023-03-28 DIAGNOSIS — M6281 Muscle weakness (generalized): Secondary | ICD-10-CM | POA: Diagnosis not present

## 2023-04-01 DIAGNOSIS — Z471 Aftercare following joint replacement surgery: Secondary | ICD-10-CM | POA: Diagnosis not present

## 2023-04-01 DIAGNOSIS — R262 Difficulty in walking, not elsewhere classified: Secondary | ICD-10-CM | POA: Diagnosis not present

## 2023-04-01 DIAGNOSIS — Z96651 Presence of right artificial knee joint: Secondary | ICD-10-CM | POA: Diagnosis not present

## 2023-04-01 DIAGNOSIS — M6281 Muscle weakness (generalized): Secondary | ICD-10-CM | POA: Diagnosis not present

## 2023-04-03 ENCOUNTER — Other Ambulatory Visit (HOSPITAL_COMMUNITY): Payer: Medicare Other

## 2023-04-03 DIAGNOSIS — Z96651 Presence of right artificial knee joint: Secondary | ICD-10-CM | POA: Diagnosis not present

## 2023-04-03 DIAGNOSIS — R262 Difficulty in walking, not elsewhere classified: Secondary | ICD-10-CM | POA: Diagnosis not present

## 2023-04-03 DIAGNOSIS — M6281 Muscle weakness (generalized): Secondary | ICD-10-CM | POA: Diagnosis not present

## 2023-04-03 DIAGNOSIS — Z471 Aftercare following joint replacement surgery: Secondary | ICD-10-CM | POA: Diagnosis not present

## 2023-04-19 ENCOUNTER — Other Ambulatory Visit: Payer: Self-pay | Admitting: Neurology

## 2023-04-19 DIAGNOSIS — G20A2 Parkinson's disease without dyskinesia, with fluctuations: Secondary | ICD-10-CM

## 2023-05-05 DIAGNOSIS — H0100A Unspecified blepharitis right eye, upper and lower eyelids: Secondary | ICD-10-CM | POA: Diagnosis not present

## 2023-05-29 DIAGNOSIS — J301 Allergic rhinitis due to pollen: Secondary | ICD-10-CM | POA: Diagnosis not present

## 2023-05-29 DIAGNOSIS — I10 Essential (primary) hypertension: Secondary | ICD-10-CM | POA: Diagnosis not present

## 2023-06-17 DIAGNOSIS — Z6825 Body mass index (BMI) 25.0-25.9, adult: Secondary | ICD-10-CM | POA: Diagnosis not present

## 2023-06-17 DIAGNOSIS — J069 Acute upper respiratory infection, unspecified: Secondary | ICD-10-CM | POA: Diagnosis not present

## 2023-06-17 DIAGNOSIS — R030 Elevated blood-pressure reading, without diagnosis of hypertension: Secondary | ICD-10-CM | POA: Diagnosis not present

## 2023-06-30 DIAGNOSIS — N183 Chronic kidney disease, stage 3 unspecified: Secondary | ICD-10-CM | POA: Diagnosis not present

## 2023-06-30 DIAGNOSIS — I10 Essential (primary) hypertension: Secondary | ICD-10-CM | POA: Diagnosis not present

## 2023-06-30 DIAGNOSIS — Z13220 Encounter for screening for lipoid disorders: Secondary | ICD-10-CM | POA: Diagnosis not present

## 2023-06-30 DIAGNOSIS — E786 Lipoprotein deficiency: Secondary | ICD-10-CM | POA: Diagnosis not present

## 2023-06-30 DIAGNOSIS — R739 Hyperglycemia, unspecified: Secondary | ICD-10-CM | POA: Diagnosis not present

## 2023-07-07 DIAGNOSIS — M4800 Spinal stenosis, site unspecified: Secondary | ICD-10-CM | POA: Diagnosis not present

## 2023-07-07 DIAGNOSIS — N401 Enlarged prostate with lower urinary tract symptoms: Secondary | ICD-10-CM | POA: Diagnosis not present

## 2023-07-07 DIAGNOSIS — F5221 Male erectile disorder: Secondary | ICD-10-CM | POA: Diagnosis not present

## 2023-07-07 DIAGNOSIS — Z2911 Encounter for prophylactic immunotherapy for respiratory syncytial virus (RSV): Secondary | ICD-10-CM | POA: Diagnosis not present

## 2023-07-07 DIAGNOSIS — M1711 Unilateral primary osteoarthritis, right knee: Secondary | ICD-10-CM | POA: Diagnosis not present

## 2023-07-07 DIAGNOSIS — M40295 Other kyphosis, thoracolumbar region: Secondary | ICD-10-CM | POA: Diagnosis not present

## 2023-07-07 DIAGNOSIS — G629 Polyneuropathy, unspecified: Secondary | ICD-10-CM | POA: Diagnosis not present

## 2023-07-07 DIAGNOSIS — K219 Gastro-esophageal reflux disease without esophagitis: Secondary | ICD-10-CM | POA: Diagnosis not present

## 2023-07-07 DIAGNOSIS — I10 Essential (primary) hypertension: Secondary | ICD-10-CM | POA: Diagnosis not present

## 2023-07-07 DIAGNOSIS — R634 Abnormal weight loss: Secondary | ICD-10-CM | POA: Diagnosis not present

## 2023-07-07 DIAGNOSIS — Z23 Encounter for immunization: Secondary | ICD-10-CM | POA: Diagnosis not present

## 2023-07-19 ENCOUNTER — Other Ambulatory Visit: Payer: Self-pay | Admitting: Cardiology

## 2023-09-10 ENCOUNTER — Other Ambulatory Visit: Payer: Self-pay

## 2023-09-10 DIAGNOSIS — G20A2 Parkinson's disease without dyskinesia, with fluctuations: Secondary | ICD-10-CM

## 2023-09-10 MED ORDER — CARBIDOPA-LEVODOPA ER 50-200 MG PO TBCR: 1.0000 | EXTENDED_RELEASE_TABLET | Freq: Every day | ORAL | 1 refills | Status: DC

## 2023-09-10 MED ORDER — CARBIDOPA-LEVODOPA 25-100 MG PO TABS
1.0000 | ORAL_TABLET | Freq: Four times a day (QID) | ORAL | 1 refills | Status: DC
Start: 2023-09-10 — End: 2024-02-11

## 2023-09-25 NOTE — Progress Notes (Signed)
 PATIENT: Gerald Knapp. DOB: 06/25/1931  REASON FOR VISIT: follow up HISTORY FROM: patient  Chief Complaint  Patient presents with   Follow-up    Pt in 1 with wife Pt here for parkinson f/u Pt states increased tremors right hand worse Pt states some gait issues Pt denies no falls in last six months Pt states right knee replacement 02/03/2023 Pt Carbidopa-levodopa cost has increased asking for cheaper medication     HISTORY OF PRESENT ILLNESS:  09/29/2023 ALL:  Gerald Knapp returns for follow up for PD. He was last seen by me 02/2023 and doing fairly well. We adjusted timing of carb/lvo IR dosing at 8a, 12a, 5p and 8p with CR dosing at 5-6p to help with worsening tremor at bedtime.   Since, he reports doing fairly well. He continues carb-levo as prescribed but admits to missing dose and or doubling dose a time or two a week. He usually takes 8am, 12p and 5p dose with meals. Tremor waxes and wanes. Seems worse at night. He does feel off balance at times. Using cane. No falls. He is s/p right TKR 01/2023. PT completed. Knee pain resolved. He continues to have significant low back pain. No difficulty swallowing. Usually sleeps well.   03/11/2023 ALL: Gerald Knapp returns for follow up for right sided PD. He was last seen by Dr Frances Furbish and doing well 07/2022. He continues carb-levo IR 1 tablet four times daily, usually around 8a, 12a, 6-7p and 8-9p. He continues carb-levo CR 1 tablet at 8p. He feels he is doing pretty well. He has noticed more of a tremor at bedtime. He is not overly bothered by it but does lie awake at times. He feels he is resting well. He usually sleeps about 9 hours. He is s/p right TKR 01/2023. He continues to work with PT. He is ambulating with walker. No falls. Appetite is good. He continues to drink Ensure daily. He denies difficulty swallowing.   07/30/2022 SA: Gerald. Breach is an 89 year old right-handed gentleman with an underlying medical history of reflux disease, lumbar  spinal stenosis with chronic low back pain, allergic rhinitis, and BPH, who presents for follow-up consultation of his right-sided parkinsonism.  The patient is accompanied by his wife again today.  I last saw him on 11/17/2019, at which time he reported feeling better on Sinemet.  He was taking 1 pill 3 times daily.  His dizziness improved after stopping terazosin.   He saw Shawnie Dapper, NP on 03/23/2020.  He saw Manuella Blackson on 09/26/2020 and then again on 04/04/2021.  Most recently, he saw Shawnie Dapper, NP on 10/03/2021.   His Sinemet IR was increased to 1 pill 4 times daily over time and Sinemet CR was added as well.   Today, 07/30/2022: He reports doing fairly well.  He has had a couple of stumbles.  He has right knee pain and gets 3 monthly injections under Dr. Wynn Banker.  He has not actually fallen.  He uses a cane when he goes outside or when he uses the bathroom at night, during the day he may not use the cane.  He feels stable cognitively.  Sinemet IR is 1 pill 4 times a day, he generally takes 1 at 8 AM, second dose around 12:15 PM or 12:30 PM, third dose around 6:30 PM or 7 PM and last dose around 9:30 PM or 10 PM.  He takes the ER around dinnertime along with IR.  10/03/2021 ALL: Gerald Knapp returns for follow up for  PD. He continues generic Sinemet 25-100mg  QID. He continues to tolerate it well. Right hand tremor waxes and wanes. He denies any changes in gait or falls. He uses a cane for support. 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 He does endorse having occasional dizzy spells. Frequency is about once per month. He endorses feeling that heart starts to race. He is followed by cardiology. 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 He was started on metoprolol tartrate 12.5mg  BID. He denies difficulty swallowing. Appetite is decreased. Weight has been relatively stable. (10 lbs lost over 2 years). He does drink Ensure. Memory is good.   He is also being followed by Dr Wynn Banker for right knee pain d/t  osteoarthritis, lumbar spinal stenosis, severe, with multilevel neurogenic claudication and right shoulder pain. He feels pain is fairly well managed.   04/04/2021 ALL:  Gerald Knapp returns for follow up for Parkinson's Disease. He continues generic Sinemet 25-100mg  QID (8am, 12pm, 6:30pm, 10pm). We added Sinemet CR 50-200mg  at bedtime at last visit due to concerns of worsening tremor at night. He does feel that it helped. He is not bothered by tremor at night. He does note waxing and waning tremor through the day but is not overly bothered by this.   Gait is stable. He did have one fall in his garden a few weeks ago while picking tomatoes. He reports leaning forward and losing his balance. No injuries. He feels that he is doing fairly well staying active. 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 He gets tired but does not feel overly fatigued or short of breath.   Swallowing seems better, rare episodes of coughing. His appetite has decreased. He thinks this is due to having more knee pain. He is working with orthopedics who is recommending knee replacement.   He was seen by PCP in 10/2020, after our last visit. He reports that PCP also heard an irregular heart rhythm. EKG was reportedly normal. BP is usually 140's/70's. He feels it is elevated, today, due to traveling 29/70 in traffic. No palpitations, excessive fatigue, dizziness, shortness of breath or stroke like symptoms.   09/26/2020 ALL:  He returns for follow up on Parkinson's Disease. He is doing well. He continues carbidopa levodopa 25/100mg  QID. He feels that tremor is failry well managed during the day. Right hand tremor seems to be worse just before bedtime. He has a hard time getting to sleep due to tremor. He is sleeping well. He continues to be very careful with eating and drinking. He feels that as long as he eats slowly and is careful with liquids, he does not get choked. He continues to drink a couple of sips of amaretto that seems to help. He was  recently seen by PCP following Covid diagnosis in January. He has recovered nicely. He reports PCP noticed an irregular heart rate at follow up. EKG was unremarkable per patient. He does have occasional weakness. He has had some intermittent dizziness over the past month. Uncertain if related to Covid.  No falls. Gait is stable. He continues to use cane.   03/23/2020 ALL:  Robet Crutchfield. is a 89 y.o. male here today for follow up for Parkinson's Disease. He continues Sinemet 1 tablet TID. Tremor has worsened slightly over the past 4 months. He feels tremor and tension more in right forearm. He is tolerating Sinemet well with no adverse effects noted. He does continue to notice more saliva production and feels he  gets strangled easily. He denies any difficulty eating or swallowing foods/liquids. He feels that he has more drainage from seasonal allergies and that he gets a little strangled on drainage and saliva. He will sip on a glass of amaretto throughout the day and this prevents him from getting strangled. He feels gait is stable. 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 He did get tripped up in his tomato patch and fell over. Fortunately, no injuries. He uses a walking stick or cane everywhere he goes. He feels that he is eating well but has lost about 11 pounds since last being seen. He feels this is related to his wife no longer cooking full meals like she used to. His wife reports that he has had a decreased appetite recently. He is drinking a high protein Boost drink every day.    HISTORY: (copied from Dr Teofilo Pod note on 11/17/2019)  Gerald. Gosnell is an 89 year old right-handed gentleman with an underlying medical history of reflux disease, lumbar spinal stenosis with chronic low back pain, allergic rhinitis, and BPH, who presents for follow-up consultation of his right-sided parkinsonism.  The patient is accompanied by his wife again today.  I first met him on 08/19/2019 at the  request of his primary care physician, at which time he reported a 2 to 87-month history of tremor affecting his right more than left hand.  His exam was in keeping with parkinsonism with right-sided lateralization noted.  He was advised to start a trial of low-dose Sinemet with gradual titration.   His wife called in the interim in February 2021 reporting that he had chest pains and dizziness.  They were not sure if these symptoms came from taking Terazosin.  He was advised to follow-up with primary care physician or proceed to the emergency room should he have chest pain.     Today, 11/17/2019: He reports feeling better with regards to his tremor, he is able to tolerate Sinemet, 1 pill 3 times daily, first pill around 7, second pill between 2 and 3 and last pill around 10 PM.  He feels that his tremor is just a little bit better and his wife endorses that when he feeds himself he does not shake quite as much.  When he stopped the Terazosin his dizziness improved and he was able to take the Sinemet all along.  He has intermittent constipation, takes a stool softener on a daily basis.  He does not drink a whole lot of water, likes to drink tea or soda. No falls.    The patient's allergies, current medications, family history, past medical history, past social history, past surgical history and problem list were reviewed and updated as appropriate.    Previously:    08/19/19: (He) reports a recent onset right more than left hand tremor, started in or around November 2020.  He reports it is primarily on the right side and happens primarily when he is sitting or when walking.  It does not seem to as noticeable when he holds something.  He does have some fine motor dyscontrol.  He has noticed difficulty swallowing at times, feels like he chokes on his own saliva even at times.  The choking-like sensation happens with liquids and solids.  He sleeps fairly well, he sleeps in a lift chair for the past year because  of his low back pain.  He has seen Dr. Shelle Iron for this, he has also had steroid injections under Dr. Ethelene Hal, about 3  or 4 altogether with limited success.  He is still active on his farm, they raise cows.  His daughter and son-in-law help on the farm, they stay on the farm.  He has no family history of tremor or Parkinson's disease.  Upon further asking, his wife has noticed change in his walking, he tends to shuffle at times and she has also noticed a more stooped posture.  He walks with a walking stick when he is outside on the farm.  He still uses his tractor and feeds the cattle.  Thankfully, he has not fallen.    REVIEW OF SYSTEMS: Out of a complete 14 system review of symptoms, the patient complains only of the following symptoms, tremor, back pain, and all other reviewed systems are negative.   ALLERGIES: No Known Allergies  HOME MEDICATIONS: Outpatient Medications Prior to Visit  Medication Sig Dispense Refill   acetaminophen (TYLENOL) 650 MG CR tablet Take 650 mg by mouth 2 (two) times daily as needed for pain.     carbidopa-levodopa (SINEMET CR) 50-200 MG tablet Take 1 tablet by mouth at bedtime. 90 tablet 1   carbidopa-levodopa (SINEMET IR) 25-100 MG tablet Take 1 tablet by mouth 4 (four) times daily. Take at 8 AM, 12, 4 PM and 8 PM daily. 360 tablet 1   famotidine-calcium carbonate-magnesium hydroxide (PEPCID COMPLETE) 10-800-165 MG chewable tablet Chew 1 tablet by mouth daily as needed (heartburn).     fluticasone (FLONASE) 50 MCG/ACT nasal spray Place 1 spray into both nostrils daily.     loratadine (CLARITIN) 10 MG tablet Take 10 mg by mouth daily.     metoprolol tartrate (LOPRESSOR) 25 MG tablet TAKE 1/2 TABLET TWICE DAILY 90 tablet 0   polyvinyl alcohol (LIQUIFILM TEARS) 1.4 % ophthalmic solution Place 1 drop into both eyes daily as needed for dry eyes.     traMADol (ULTRAM) 50 MG tablet Take 1-2 tablets (50-100 mg total) by mouth every 6 (six) hours as needed for moderate pain.  (Patient taking differently: Take 50-100 mg by mouth as needed for moderate pain (pain score 4-6).) 40 tablet 0   methocarbamol (ROBAXIN) 500 MG tablet Take 1 tablet (500 mg total) by mouth every 6 (six) hours as needed for muscle spasms. (Patient not taking: Reported on 03/11/2023) 40 tablet 0   ondansetron (ZOFRAN) 4 MG tablet Take 1 tablet (4 mg total) by mouth every 6 (six) hours as needed for nausea. 20 tablet 0   oxyCODONE (OXY IR/ROXICODONE) 5 MG immediate release tablet Take 1-2 tablets (5-10 mg total) by mouth every 6 (six) hours as needed for severe pain. (Patient not taking: Reported on 03/11/2023) 42 tablet 0   No facility-administered medications prior to visit.    PAST MEDICAL HISTORY: Past Medical History:  Diagnosis Date   Allergic rhinitis    Arthritis    Benign prostate hyperplasia    Chronic kidney disease    GERD (gastroesophageal reflux disease)    Hypertension    Tachycardia    Tremor     PAST SURGICAL HISTORY: Past Surgical History:  Procedure Laterality Date   CARDIAC CATHETERIZATION     CATARACT EXTRACTION     COLONOSCOPY     HERNIA REPAIR     NOSE SURGERY     TOTAL KNEE ARTHROPLASTY Right 02/03/2023   Procedure: TOTAL KNEE ARTHROPLASTY;  Surgeon: Ollen Gross, MD;  Location: [facility];  Service: Orthopedics;  Laterality: Right;    FAMILY HISTORY: Family History  Problem Relation Age of Onset  Arthritis Mother    Hypertension Mother    Cancer Father    Diabetes Father    Arthritis Father    Parkinson's disease Neg Hx     SOCIAL HISTORY: Social History   Socioeconomic History   Marital status: Married    Spouse name: Management consultant    Number of children: Not on file   Years of education: Not on file   Highest education level: Not on file  Occupational History   Not on file  Tobacco Use   Smoking status: Never   Smokeless tobacco: Never  Vaping Use   Vaping status: Never Used  Substance and Sexual Activity   Alcohol use: Yes    Comment: rarely a  drink   Drug use: Never   Sexual activity: Not on file  Other Topics Concern   Not on file  Social History Narrative   Lives on a farm with his wife. Daughter and son-in-law live on the same farm in a separate house   Right handed      Retired    Social Drivers of Corporate investment banker Strain: Not on file  Food Insecurity: No Food Insecurity (02/03/2023)   Hunger Vital Sign    Worried About Running Out of Food in the Last Year: Never true    Ran Out of Food in the Last Year: Never true  Transportation Needs: No Transportation Needs (02/03/2023)   PRAPARE - Administrator, Civil Service (Medical): No    Lack of Transportation (Non-Medical): No  Physical Activity: Not on file  Stress: Not on file  Social Connections: Not on file  Intimate Partner Violence: Not At Risk (02/03/2023)   Humiliation, Afraid, Rape, and Kick questionnaire    Fear of Current or Ex-Partner: No    Emotionally Abused: No    Physically Abused: No    Sexually Abused: No      PHYSICAL EXAM  Vitals:   09/29/23 1050 09/29/23 1124  BP: (!) 180/77 (!) 172/80  Pulse: (!) 5 (!) 58  Weight: 176 lb (79.8 kg)   Height: 5\' 5"  (1.651 m)       Body mass index is 29.29 kg/m.  Generalized: Well developed, in no acute distress  Cardiology: irregular rhythm, rate normal, no murmur noted Respiratory: clear to auscultation bilaterally  Neurological examination  Mentation: Alert oriented to time, place, history taking. Follows all commands speech and language fluent Cranial nerve II-XII: Pupils were equal round reactive to light. Extraocular movements were full, visual field were full on confrontational test. Facial sensation and strength were normal. Uvula tongue midline. Head turning and shoulder shrug  were normal and symmetric. Motor: The motor testing reveals 5 over 5 strength of all 4 extremities. Good symmetric motor tone is noted throughout. Resting tremor noted of right hand  Sensory:  Sensory testing is intact to soft touch on all 4 extremities. No evidence of extinction is noted.  Coordination: Cerebellar testing reveals good finger-nose-finger and heel-to-shin bilaterally. Rapid alternating movements were smooth. Gait and station: Stooped posture, Gait is short and slow, stable with walker, unable to tandem Reflexes: Deep tendon reflexes are symmetric and normal bilaterally.   DIAGNOSTIC DATA (LABS, IMAGING, TESTING) - I reviewed patient records, labs, notes, testing and imaging myself where available.      No data to display           Lab Results  Component Value Date   WBC 13.0 (H) 02/05/2023   HGB 9.5 (L) 02/05/2023  HCT 29.4 (L) 02/05/2023   MCV 96.4 02/05/2023   PLT 216 02/05/2023      Component Value Date/Time   NA 135 02/04/2023 0418   K 4.2 02/04/2023 0418   CL 101 02/04/2023 0418   CO2 26 02/04/2023 0418   GLUCOSE 162 (H) 02/04/2023 0418   BUN 21 02/04/2023 0418   CREATININE 1.06 02/04/2023 0418   CALCIUM 8.7 (L) 02/04/2023 0418   GFRNONAA >60 02/04/2023 0418   No results found for: "CHOL", "HDL", "LDLCALC", "LDLDIRECT", "TRIG", "CHOLHDL" No results found for: "HGBA1C" No results found for: "VITAMINB12" No results found for: "TSH"     ASSESSMENT AND PLAN 89 y.o. year old male  has a past medical history of Allergic rhinitis, Arthritis, Benign prostate hyperplasia, Chronic kidney disease, GERD (gastroesophageal reflux disease), Hypertension, Tachycardia, and Tremor. here with     ICD-10-CM   1. Parkinson's disease without dyskinesia, with fluctuating manifestations (HCC)  G20.A2     2. Elevated blood pressure reading  R03.0     3. Bilateral low back pain, unspecified chronicity, unspecified whether sciatica present  M54.50       Gerald Knapp is doing well, overall. He continues to tolerate generic Sinemet. We will continue 25-100mg  QID and 50-200mg  CR dosing at bedtime. I have advised he try taking IR dosing at 8a, 12a, 5p and 8p with CR  dosing at 5-6p. We discussed importance of taking IR dosing 30 minutes prior to meals. He will document symptoms for better evaluation at next visit. I have advised he monitor BP at home. Home readings typically 130-140/80s. He will continue to follow cardiology, ortho and PCP.  He will continue regular activity. Fall precautions reviewed. He will focus on healthy lifestyle habits. He will follow up with Dr Frances Furbish in 6 months, sooner if needed.    No orders of the defined types were placed in this encounter.     No orders of the defined types were placed in this encounter.    I spent 30 minutes of face-to-face and non-face-to-face time with patient.  This included previsit chart review, lab review, study review, order entry, electronic health record documentation, patient education.    Shawnie Dapper, FNP-C 09/29/2023, 12:38 PM [facility] [address], [address] [place], [place] [address] [phone]

## 2023-09-25 NOTE — Patient Instructions (Signed)
 Below is our plan:  We will try taking carb-levo IR consistently at 8am, 12p, 5p and 8p. Make sure you are taking it 30 minutes prior to eating. Continue car-levo CR for now at 5pm. Let me know if you feel it is not helping and we can adjust dosing.   Please make sure you are staying well hydrated. I recommend 50-60 ounces daily. Well balanced diet and regular exercise encouraged. Consistent sleep schedule with 6-8 hours recommended.   Please continue follow up with care team as directed.   Follow up with Dr Frances Furbish in 6 months   You may receive a survey regarding today's visit. I encourage you to leave honest feed back as I do use this information to improve patient care. Thank you for seeing me today!

## 2023-09-29 ENCOUNTER — Ambulatory Visit (INDEPENDENT_AMBULATORY_CARE_PROVIDER_SITE_OTHER): Payer: Medicare Other | Admitting: Family Medicine

## 2023-09-29 ENCOUNTER — Encounter: Payer: Self-pay | Admitting: Family Medicine

## 2023-09-29 VITALS — BP 172/80 | HR 58 | Ht 65.0 in | Wt 176.0 lb

## 2023-09-29 DIAGNOSIS — M545 Low back pain, unspecified: Secondary | ICD-10-CM | POA: Diagnosis not present

## 2023-09-29 DIAGNOSIS — G20A2 Parkinson's disease without dyskinesia, with fluctuations: Secondary | ICD-10-CM | POA: Diagnosis not present

## 2023-09-29 DIAGNOSIS — R030 Elevated blood-pressure reading, without diagnosis of hypertension: Secondary | ICD-10-CM

## 2023-10-02 ENCOUNTER — Other Ambulatory Visit: Payer: Self-pay | Admitting: Cardiology

## 2023-10-29 DIAGNOSIS — D0422 Carcinoma in situ of skin of left ear and external auricular canal: Secondary | ICD-10-CM | POA: Diagnosis not present

## 2023-10-29 DIAGNOSIS — D485 Neoplasm of uncertain behavior of skin: Secondary | ICD-10-CM | POA: Diagnosis not present

## 2023-10-29 DIAGNOSIS — L570 Actinic keratosis: Secondary | ICD-10-CM | POA: Diagnosis not present

## 2023-11-10 DIAGNOSIS — Z6836 Body mass index (BMI) 36.0-36.9, adult: Secondary | ICD-10-CM | POA: Diagnosis not present

## 2023-11-10 DIAGNOSIS — Z13220 Encounter for screening for lipoid disorders: Secondary | ICD-10-CM | POA: Diagnosis not present

## 2023-11-10 DIAGNOSIS — Z0001 Encounter for general adult medical examination with abnormal findings: Secondary | ICD-10-CM | POA: Diagnosis not present

## 2023-11-10 DIAGNOSIS — D529 Folate deficiency anemia, unspecified: Secondary | ICD-10-CM | POA: Diagnosis not present

## 2023-11-10 DIAGNOSIS — Z1329 Encounter for screening for other suspected endocrine disorder: Secondary | ICD-10-CM | POA: Diagnosis not present

## 2023-11-13 ENCOUNTER — Ambulatory Visit: Payer: Medicare Other | Attending: Nurse Practitioner | Admitting: Nurse Practitioner

## 2023-11-13 ENCOUNTER — Encounter: Payer: Self-pay | Admitting: Nurse Practitioner

## 2023-11-13 VITALS — BP 130/72 | HR 54 | Ht 65.0 in | Wt 173.0 lb

## 2023-11-13 DIAGNOSIS — R002 Palpitations: Secondary | ICD-10-CM | POA: Diagnosis not present

## 2023-11-13 DIAGNOSIS — I471 Supraventricular tachycardia, unspecified: Secondary | ICD-10-CM | POA: Insufficient documentation

## 2023-11-13 DIAGNOSIS — R000 Tachycardia, unspecified: Secondary | ICD-10-CM

## 2023-11-13 MED ORDER — METOPROLOL TARTRATE 25 MG PO TABS: 12.5000 mg | ORAL_TABLET | Freq: Two times a day (BID) | ORAL | 3 refills | Status: AC

## 2023-11-13 NOTE — Progress Notes (Signed)
 Cardiology Office Note:  .   Date:  11/13/2023 ID:  Gerald Knapp., DOB [birth year], MRN [id number] PCP: Orest Bio, MD  [facility] Providers Cardiologist:  Armida Lander, MD    History of Present Illness: .   Gerald Knapp. is a 89 y.o. male with a PMH of PSVT, palpitations, Parkinson's disease, and spinal stenosis, who presents today for 1 year follow-up.   Last seen by Dr. Armida Lander on September 19, 2022. Was doing well at the time.   Today he presents for 1 year follow-up. He states he is doing well. Admits to difficult with taking current dose of Lopressor , says it is hard for him to split the pill in half with his PD. Denies any chest pain, shortness of breath, palpitations, syncope, presyncope, dizziness, orthopnea, PND, swelling or significant weight changes, acute bleeding, or claudication. Says he just had labwork done with Dr. Marykay Snipes.   ROS: Negative. See HPI.  SH: Married for 65 years. 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 His daughter Gerald Knapp) is also a patient of mine.   Studies Reviewed: Aaron Aas    EKG: EKG Interpretation Date/Time:  Thursday November 13 2023 11:12:52 EDT Ventricular Rate:  53 PR Interval:  158 QRS Duration:  94 QT Interval:  420 QTC Calculation: 394 R Axis:   100  Text Interpretation: Sinus bradycardia with sinus arrhythmia Rightward axis When compared with ECG of 27-Nov-2001 12:57, Nonspecific T wave abnormality now evident in Lateral leads Confirmed by Lasalle Pointer [id number]) on 11/13/2023 11:13:57 AM   Cardiac monitor 06/2021:  14 day monitor Rare supraventricular ectopy in the form of isolated PACs, couplets, triplets. Rare runs of SVT lasting up to and 30 seconds Symptoms correlated with sinus rhythm with PVCs Nocturnal bradycardia to 39 bpm     Patch Wear Time:  13 days and 17 hours (2022-11-18T16:40:48-0500 to 2022-12-02T09:52:47-0500)   Patient had a min HR of 39 bpm, max HR of 171  bpm, and avg HR of 65 bpm. Predominant underlying rhythm was Sinus Rhythm. First Degree AV Block was present. 11 Supraventricular Tachycardia runs occurred, the run with the fastest interval lasting 4 beats  with a max rate of 171 bpm, the longest lasting 2 mins 28 secs with an avg rate of 147 bpm. Idioventricular Rhythm was present. Isolated SVEs were rare (<1.0%), SVE Couplets were rare (<1.0%), and SVE Triplets were rare (<1.0%). Isolated VEs were  occasional (4.8%, 61302), VE Couplets were rare (<1.0%, 508), and VE Triplets were rare (<1.0%, 56). Ventricular Bigeminy and Trigeminy were present.  Physical Exam:   VS:  BP 130/72   Pulse (!) 54   Ht 5\' 5"  (1.651 m)   Wt 173 lb (78.5 kg)   SpO2 99%   BMI 28.79 kg/m    Wt Readings from Last 3 Encounters:  11/13/23 173 lb (78.5 kg)  09/29/23 176 lb (79.8 kg)  03/11/23 176 lb 8 oz (80.1 kg)    GEN: Well nourished, well developed in no acute distress NECK: No JVD; No carotid bruits CARDIAC: S1/S2, slow rate and overall regular rhythm, no murmurs, rubs, gallops RESPIRATORY:  Clear to auscultation without rales, wheezing or rhonchi  ABDOMEN: Soft, non-tender, non-distended EXTREMITIES:  No edema; No deformity, some mild tremors to bilateral arms at rest  ASSESSMENT AND PLAN: .    PSVT, palpitations Doing well and tolerating medications well. Denies any tachycardia or palpitations. HR 54 bpm, asymptomatic. Continue Lopressor  and did  recommend talking with PCP about pill packs. Heart healthy diet and regular cardiovascular exercise encouraged. Will request most recent labs from PCP.    Dispo: Follow-up with Dr. Armida Lander or APP in 1 year or sooner if anything changes.   Signed, Lasalle Pointer, NP

## 2023-11-13 NOTE — Patient Instructions (Addendum)
 Medication Instructions:  Your physician recommends that you continue on your current medications as directed. Please refer to the Current Medication list given to you today.  Labwork: None.   Testing/Procedures: None   Follow-Up: Your physician recommends that you schedule a follow-up appointment in: 1 Year   Any Other Special Instructions Will Be Listed Below (If Applicable). Ask your Primary Doctor about doing Pill packs   If you need a refill on your cardiac medications before your next appointment, please call your pharmacy.

## 2023-11-17 DIAGNOSIS — Z6825 Body mass index (BMI) 25.0-25.9, adult: Secondary | ICD-10-CM | POA: Diagnosis not present

## 2023-11-17 DIAGNOSIS — Z23 Encounter for immunization: Secondary | ICD-10-CM | POA: Diagnosis not present

## 2023-11-17 DIAGNOSIS — R634 Abnormal weight loss: Secondary | ICD-10-CM | POA: Diagnosis not present

## 2023-11-17 DIAGNOSIS — G894 Chronic pain syndrome: Secondary | ICD-10-CM | POA: Diagnosis not present

## 2023-11-17 DIAGNOSIS — N401 Enlarged prostate with lower urinary tract symptoms: Secondary | ICD-10-CM | POA: Diagnosis not present

## 2023-11-17 DIAGNOSIS — N400 Enlarged prostate without lower urinary tract symptoms: Secondary | ICD-10-CM | POA: Diagnosis not present

## 2023-11-20 DIAGNOSIS — C44229 Squamous cell carcinoma of skin of left ear and external auricular canal: Secondary | ICD-10-CM | POA: Diagnosis not present

## 2023-11-26 DIAGNOSIS — I301 Infective pericarditis: Secondary | ICD-10-CM | POA: Diagnosis not present

## 2023-11-26 DIAGNOSIS — G20A1 Parkinson's disease without dyskinesia, without mention of fluctuations: Secondary | ICD-10-CM | POA: Diagnosis not present

## 2023-12-11 DIAGNOSIS — H10503 Unspecified blepharoconjunctivitis, bilateral: Secondary | ICD-10-CM | POA: Diagnosis not present

## 2023-12-25 DIAGNOSIS — H10503 Unspecified blepharoconjunctivitis, bilateral: Secondary | ICD-10-CM | POA: Diagnosis not present

## 2023-12-26 DIAGNOSIS — J301 Allergic rhinitis due to pollen: Secondary | ICD-10-CM | POA: Diagnosis not present

## 2023-12-26 DIAGNOSIS — G20A1 Parkinson's disease without dyskinesia, without mention of fluctuations: Secondary | ICD-10-CM | POA: Diagnosis not present

## 2024-01-26 DIAGNOSIS — J301 Allergic rhinitis due to pollen: Secondary | ICD-10-CM | POA: Diagnosis not present

## 2024-01-26 DIAGNOSIS — G20A1 Parkinson's disease without dyskinesia, without mention of fluctuations: Secondary | ICD-10-CM | POA: Diagnosis not present

## 2024-02-05 ENCOUNTER — Other Ambulatory Visit: Payer: Self-pay

## 2024-02-10 ENCOUNTER — Other Ambulatory Visit: Payer: Self-pay

## 2024-02-11 ENCOUNTER — Other Ambulatory Visit: Payer: Self-pay

## 2024-02-11 DIAGNOSIS — G20A2 Parkinson's disease without dyskinesia, with fluctuations: Secondary | ICD-10-CM

## 2024-02-11 MED ORDER — CARBIDOPA-LEVODOPA 25-100 MG PO TABS
1.0000 | ORAL_TABLET | Freq: Four times a day (QID) | ORAL | 0 refills | Status: DC
Start: 2024-02-11 — End: 2024-04-26

## 2024-02-26 DIAGNOSIS — G20A1 Parkinson's disease without dyskinesia, without mention of fluctuations: Secondary | ICD-10-CM | POA: Diagnosis not present

## 2024-02-26 DIAGNOSIS — J301 Allergic rhinitis due to pollen: Secondary | ICD-10-CM | POA: Diagnosis not present

## 2024-03-16 DIAGNOSIS — Z0001 Encounter for general adult medical examination with abnormal findings: Secondary | ICD-10-CM | POA: Diagnosis not present

## 2024-03-16 DIAGNOSIS — Z13220 Encounter for screening for lipoid disorders: Secondary | ICD-10-CM | POA: Diagnosis not present

## 2024-03-16 DIAGNOSIS — G894 Chronic pain syndrome: Secondary | ICD-10-CM | POA: Diagnosis not present

## 2024-03-16 DIAGNOSIS — D529 Folate deficiency anemia, unspecified: Secondary | ICD-10-CM | POA: Diagnosis not present

## 2024-03-16 DIAGNOSIS — R634 Abnormal weight loss: Secondary | ICD-10-CM | POA: Diagnosis not present

## 2024-03-23 DIAGNOSIS — N401 Enlarged prostate with lower urinary tract symptoms: Secondary | ICD-10-CM | POA: Diagnosis not present

## 2024-03-23 DIAGNOSIS — I10 Essential (primary) hypertension: Secondary | ICD-10-CM | POA: Diagnosis not present

## 2024-03-23 DIAGNOSIS — G20A1 Parkinson's disease without dyskinesia, without mention of fluctuations: Secondary | ICD-10-CM | POA: Diagnosis not present

## 2024-03-23 DIAGNOSIS — Z6824 Body mass index (BMI) 24.0-24.9, adult: Secondary | ICD-10-CM | POA: Diagnosis not present

## 2024-03-23 DIAGNOSIS — G894 Chronic pain syndrome: Secondary | ICD-10-CM | POA: Diagnosis not present

## 2024-04-05 ENCOUNTER — Encounter: Payer: Self-pay | Admitting: Neurology

## 2024-04-05 ENCOUNTER — Ambulatory Visit (INDEPENDENT_AMBULATORY_CARE_PROVIDER_SITE_OTHER): Admitting: Neurology

## 2024-04-05 VITALS — BP 164/70 | HR 47 | Ht 65.0 in | Wt 169.0 lb

## 2024-04-05 DIAGNOSIS — K117 Disturbances of salivary secretion: Secondary | ICD-10-CM

## 2024-04-05 DIAGNOSIS — Z9181 History of falling: Secondary | ICD-10-CM | POA: Diagnosis not present

## 2024-04-05 DIAGNOSIS — G20A2 Parkinson's disease without dyskinesia, with fluctuations: Secondary | ICD-10-CM | POA: Diagnosis not present

## 2024-04-05 NOTE — Patient Instructions (Addendum)
 I am worried about your driving, I am not convinced that you are completely safe to drive any longer.   I suggest you have one of your kids sit with you to monitor it, or you can you get a more formal evaluation done. Here are some options:  I. [facility] in [place]: [phone]  II.   [facility]: [phone]  III.   [facility]: [phone]  IV.   [facility]: [phone] or [phone]   2. I do not recommend you use any power tool or dangerous equipment, such as ride on mowers or tractors or farm equipment.   3. You have fallen more than once. I recommend you use your walker at all times.   4. We will keep your medication regimen the same. Please do not double up if you forgot a dose.  If it has been an hour or 2, you can still take a dose and delay the next one by an hour or 2. If it has been 3-4 hours, just skip the dose altogether.   5. Follow up in about 6 months to see Amy.

## 2024-04-05 NOTE — Progress Notes (Signed)
 Subjective:    Patient ID: Gerald Knapp. is a 89 y.o. male.  HPI    Interim history:   Gerald Knapp is a 89 year old right-handed gentleman with an underlying medical history of reflux disease, lumbar spinal stenosis with chronic low back pain, allergic rhinitis, and BPH, who presents for follow-up consultation of his right-sided parkinsonism.  The patient is accompanied by his wife again today.  He was last seen in our clinic by Greig Forbes, NP in March 2025, at which time he was advised to continue with generic Sinemet  25-100 mg strength 1 pill 4 times daily and Sinemet  CR 50-200 mg strength at bedtime.  He was advised to take his immediate release levodopa  4 hourly starting around 8 AM.    Today, 04/05/2024: He reports overall doing okay, he has noticed a worsening tremor particularly on the right side, he fell a couple of times within the past 6 months.  1 time he fell in the tool shed and scraped his arm and his wife initially did not know where he was.  He still uses farm equipment and is in the process of buying a new tractor as the current tractor is hard for him to get on.  He uses a cane outside the home, typically not inside the home.  He had interim knee replacement surgery last year and has done quite well.  He still takes tramadol  as needed, not every day.  He tries to hydrate well.  He still drives, his wife feels that his driving is okay, typically they stay within 10 miles radius but he has not had his daughter or son-in-law evaluate his driving recently by sitting with him.  He has also noted intermittent excess salivation.  Previously:  09/29/2023 Gerald Forbes, NP): <<Gerald Claypool returns for follow up for PD. He was last seen by me 02/2023 and doing fairly well. We adjusted timing of carb/lvo IR dosing at 8a, 12a, 5p and 8p with CR dosing at 5-6p to help with worsening tremor at bedtime.    Since, he reports doing fairly well. He continues carb-levo as prescribed but admits to  missing dose and or doubling dose a time or two a week. He usually takes 8am, 12p and 5p dose with meals. Tremor waxes and wanes. Seems worse at night. He does feel off balance at times. Using cane. No falls. He is s/p right TKR 01/2023. PT completed. Knee pain resolved. He continues to have significant low back pain. No difficulty swallowing. Usually sleeps well. >>   03/11/2023 (ALL): << Gerald Knapp returns for follow up for right sided PD. He was last seen by Dr Buck and doing well 07/2022. He continues carb-levo IR 1 tablet four times daily, usually around 8a, 12a, 6-7p and 8-9p. He continues carb-levo CR 1 tablet at 8p. He feels he is doing pretty well. He has noticed more of a tremor at bedtime. He is not overly bothered by it but does lie awake at times. He feels he is resting well. He usually sleeps about 9 hours. He is s/p right TKR 01/2023. He continues to work with PT. He is ambulating with walker. No falls. Appetite is good. He continues to drink Ensure daily. He denies difficulty swallowing.  >>  07/30/2022: He reports doing fairly well.  He has had a couple of stumbles.  He has right knee pain and gets 3 monthly injections under Dr. Carilyn.  He has not actually fallen.  He uses a cane when he  goes outside or when he uses the bathroom at night, during the day he may not use the cane.  He feels stable cognitively.  Sinemet  IR is 1 pill 4 times a day, he generally takes 1 at 8 AM, second dose around 12:15 PM or 12:30 PM, third dose around 6:30 PM or 7 PM and last dose around 9:30 PM or 10 PM.  He takes the ER around dinnertime along with IR.    I first met him on 08/19/2019 at the request of his primary care physician, at which time he reported a 2 to 88-month history of tremor affecting his right more than left hand.  His exam was in keeping with parkinsonism with right-sided lateralization noted.  He was advised to start a trial of low-dose Sinemet  with gradual titration.   His wife called in the  interim in February 2021 reporting that he had chest pains and dizziness.  They were not sure if these symptoms came from taking Terazosin.  He was advised to follow-up with primary care physician or proceed to the emergency room should he have chest pain.    He saw Greig Forbes, NP on 03/23/2020.  He saw Amy on 09/26/2020 and then again on 04/04/2021.  Most recently, he saw Greig Forbes, NP on 10/03/2021. His Sinemet  IR was increased to 1 pill 4 times daily over time and Sinemet  CR was added as well.    I saw him on 11/17/2019, at which time he reported feeling better on Sinemet .  He was taking 1 pill 3 times daily.  His dizziness improved after stopping terazosin.         08/19/19 (SA): (He) reports a recent onset right more than left hand tremor, started in or around November 2020.  He reports it is primarily on the right side and happens primarily when he is sitting or when walking.  It does not seem to as noticeable when he holds something.  He does have some fine motor dyscontrol.  He has noticed difficulty swallowing at times, feels like he chokes on his own saliva even at times.  The choking-like sensation happens with liquids and solids.  He sleeps fairly well, he sleeps in a lift chair for the past year because of his low back pain.  He has seen Dr. Duwayne for this, he has also had steroid injections under Dr. Bonner, about 3 or 4 altogether with limited success.  He is still active on his farm, they raise cows.  His daughter and son-in-law help on the farm, they stay on the farm.  He has no family history of tremor or Parkinson's disease.  Upon further asking, his wife has noticed change in his walking, he tends to shuffle at times and she has also noticed a more stooped posture.  He walks with a walking stick when he is outside on the farm.  He still uses his tractor and feeds the cattle.  Thankfully, he has not fallen.     His Past Medical History Is Significant For: Past Medical History:  Diagnosis  Date   Allergic rhinitis    Arthritis    Benign prostate hyperplasia    Chronic kidney disease    GERD (gastroesophageal reflux disease)    Hypertension    Tachycardia    Tremor     His Past Surgical History Is Significant For: Past Surgical History:  Procedure Laterality Date   CARDIAC CATHETERIZATION     CATARACT EXTRACTION     COLONOSCOPY  HERNIA REPAIR     NOSE SURGERY     TOTAL KNEE ARTHROPLASTY Right 02/03/2023   Procedure: TOTAL KNEE ARTHROPLASTY;  Surgeon: Melodi Lerner, MD;  Location: [facility];  Service: Orthopedics;  Laterality: Right;    His Family History Is Significant For: Family History  Problem Relation Age of Onset   Arthritis Mother    Hypertension Mother    Cancer Father    Diabetes Father    Arthritis Father    Parkinson's disease Neg Hx     His Social History Is Significant For: Social History   Socioeconomic History   Marital status: Married    Spouse name: Management consultant    Number of children: Not on file   Years of education: Not on file   Highest education level: Not on file  Occupational History   Not on file  Tobacco Use   Smoking status: Never   Smokeless tobacco: Never  Vaping Use   Vaping status: Never Used  Substance and Sexual Activity   Alcohol  use: Yes    Comment: occ beer   Drug use: Never   Sexual activity: Not on file  Other Topics Concern   Not on file  Social History Narrative   Lives [place] a farm with his wife. Daughter and son-in-law live on the same farm in a separate house   Right handed      Retired    Social Drivers of Corporate investment banker Strain: Not on file  Food Insecurity: No Food Insecurity (02/03/2023)   Hunger Vital Sign    Worried About Running Out of Food in the Last Year: Never true    Ran Out of Food in the Last Year: Never true  Transportation Needs: No Transportation Needs (02/03/2023)   PRAPARE - Administrator, Civil Service (Medical): No    Lack of Transportation (Non-Medical): No   Physical Activity: Not on file  Stress: Not on file  Social Connections: Not on file    His Allergies Are:  No Known Allergies:   His Current Medications Are:  Outpatient Encounter Medications as of 04/05/2024  Medication Sig   acetaminophen  (TYLENOL ) 650 MG CR tablet Take 650 mg by mouth 2 (two) times daily as needed for pain.   carbidopa -levodopa  (SINEMET  CR) 50-200 MG tablet Take 1 tablet by mouth at bedtime.   carbidopa -levodopa  (SINEMET  IR) 25-100 MG tablet Take 1 tablet by mouth 4 (four) times daily. Take at 8 AM, 12, 4 PM and 8 PM daily.   famotidine -calcium carbonate-magnesium hydroxide (PEPCID  COMPLETE) 10-800-165 MG chewable tablet Chew 1 tablet by mouth daily as needed (heartburn).   fluticasone  (FLONASE ) 50 MCG/ACT nasal spray Place 1 spray into both nostrils daily.   loratadine  (CLARITIN ) 10 MG tablet Take 10 mg by mouth daily.   metoprolol  tartrate (LOPRESSOR ) 25 MG tablet Take 0.5 tablets (12.5 mg total) by mouth 2 (two) times daily.   polyvinyl alcohol  (LIQUIFILM TEARS) 1.4 % ophthalmic solution Place 1 drop into both eyes daily as needed for dry eyes.   traMADol  (ULTRAM ) 50 MG tablet Take 1-2 tablets (50-100 mg total) by mouth every 6 (six) hours as needed for moderate pain. (Patient taking differently: Take 50-100 mg by mouth as needed for moderate pain (pain score 4-6).)   No facility-administered encounter medications on file as of 04/05/2024.  :  Review of Systems:  Out of a complete 14 point review of systems, all are reviewed and negative with the exception of these symptoms as  listed below:  Review of Systems  Neurological:        Pt here for parkinson f/u Pt states increased tremors on right side of body Pt states 2 falls in last six months      Objective:  Neurological Exam  Physical Exam Physical Examination:   Vitals:   04/05/24 1033  BP: (!) 164/70  Pulse: (!) 47    General Examination: The patient is a very pleasant 89 y.o. male in no acute  distress. He appears frail.  Well-groomed.   HEENT: Normocephalic, atraumatic, pupils are equal, round and reactive to light, tracking is mildly impaired, he wears corrective eyeglasses, he is hard of hearing, no hearing aids.  Face is symmetric, mild facial masking noted, mild to moderate nuchal rigidity noted, no lip, neck or jaw tremor, airway examination mild mouth dryness but also intermittent sialorrhea noted.  Salivation.  Intermittent lower jaw tremor noted.  There are no carotid bruits on auscultation.    Chest: Clear to auscultation without wheezing, rhonchi or crackles noted.   Heart: S1+S2+0, regular and normal without murmurs, rubs or gallops noted.    Abdomen: Soft, non-tender and non-distended with normal bowel sounds appreciated on auscultation.   Extremities: There is trace edema in the left distal lower extremity.      Skin: Warm and dry with chronic age-related changes noted and chronic appearing bruising in the forearms.     Musculoskeletal: exam reveals arthritic changes in both hands, unremarkable right knee replacement scar.  Significantly increased lumbar kyphosis.     Neurologically:  Mental status: The patient is awake, alert and oriented in all 4 spheres. His immediate and remote memory, attention, language skills and fund of knowledge are appropriate. There is no evidence of aphasia, agnosia, apraxia or anomia. Speech is clear with normal prosody and enunciation. Thought process is linear. Mood is normal and affect is normal.  Cranial nerves II - XII are as described above under HEENT exam.  Motor exam: Normal bulk and strength for age noted. There is no drift, or rebound. Romberg is not tested for safety concerns.    (On 08/19/2019: On Archimedes spiral drawing, he has mild insecurity with both hands but no obvious trembling noted, handwriting is legible, not particularly tremulous with the right hand but small.)   He has a mild to moderate resting tremor in the  right upper extremity and mild resting tremor in the left upper extremity.  No lower extremity tremor noted.   Tone is increased in both upper extremities.  Overall mild to moderate bradykinesia noted.  Fine motor skills are moderately impaired in the upper extremities and lower extremities with some lateralization to the right.   He stands with mild difficulty and posture is significantly stooped with significantly increased lumbar kyphosis noted.  He uses a single-point wooden cane to walk.  Balance is mildly impaired, particularly with turns, turns in multiple steps, stands a little wider based.   Assessment and Plan:    In summary, Kylor Valverde is a 89 year old right-handed gentleman with an underlying medical history of reflux disease, lumbar spinal stenosis with chronic low back pain, arthritis with status post right total knee replacement, allergic rhinitis, and BPH, who presents for follow-up consultation of his right-sided parkinsonism, complicated by falls.  He has been on Sinemet  since January 2021 with tolerance and improvement in the tremor noted by pt and on exam. He has had intermittent dizziness, he has fallen.  We talked about the importance of fall prevention  at length today.  He does admit that sometimes he forgets a dose or he is out and about and may not have the levodopa  with him at the time.  He is advised not to take 2 tablets together to make up for missed doses.   I am not convinced that he is safe to drive and I do not believe he is completely safe to handle farm equipment, power tools or heavy machinery. Below is a summary of my recommendations and our discussion points from today's visit, based on chart review, history and examination. They were given these instructions verbally during the visit in detail and also in writing in the MyChart after visit summary (AVS), which was printed.   << I am worried about your driving, I am not convinced that you are completely safe to drive  any longer.   I suggest you have one of your kids sit with you to monitor it, or you can you get a more formal evaluation done. Here are some options:  I. [facility] in [place]: [phone]  II.   [facility]: [phone]  III.   [facility]: [phone]  IV.   [facility]: [phone] or [phone]   2. I do not recommend you use any power tool or dangerous equipment, such as ride on mowers or tractors or farm equipment.   3. You have fallen more than once. I recommend you use your walker at all times.   4. We will keep your medication regimen the same. Please do not double up if you forgot a dose.  If it has been an hour or 2, you can still take a dose and delay the next one by an hour or 2. If it has been 3-4 hours, just skip the dose altogether.   5. Follow up in about 6 months to see Amy.  >>   I spent 45 minutes in total face-to-face time and in reviewing records during pre-charting, more than 50% of which was spent in counseling and coordination of care, reviewing test results, reviewing medications and treatment regimen and/or in discussing or reviewing the diagnosis of PD, the prognosis and treatment options. Pertinent laboratory and imaging test results that were available during this visit with the patient were reviewed by me and considered in my medical decision making (see chart for details).

## 2024-04-26 ENCOUNTER — Other Ambulatory Visit: Payer: Self-pay | Admitting: *Deleted

## 2024-04-26 DIAGNOSIS — G20A2 Parkinson's disease without dyskinesia, with fluctuations: Secondary | ICD-10-CM

## 2024-04-26 MED ORDER — CARBIDOPA-LEVODOPA 25-100 MG PO TABS: 1.0000 | ORAL_TABLET | Freq: Four times a day (QID) | ORAL | 2 refills | Status: DC

## 2024-04-26 NOTE — Telephone Encounter (Signed)
 Last seen on 04/05/24 Follow up scheduled on 11/17/23   Dispensed Days Supply Quantity Provider Pharmacy  CARBIDOPA -LEVODOPA  25-100 TAB 02/11/2024 90  Lomax, Amy, NP [facility] Mail D...     Next refill due on 05/14/24

## 2024-05-04 DIAGNOSIS — L570 Actinic keratosis: Secondary | ICD-10-CM | POA: Diagnosis not present

## 2024-05-19 DIAGNOSIS — Z23 Encounter for immunization: Secondary | ICD-10-CM | POA: Diagnosis not present

## 2024-07-12 ENCOUNTER — Telehealth: Payer: Self-pay | Admitting: Neurology

## 2024-07-12 DIAGNOSIS — H43391 Other vitreous opacities, right eye: Secondary | ICD-10-CM | POA: Diagnosis not present

## 2024-07-12 DIAGNOSIS — G20A2 Parkinson's disease without dyskinesia, with fluctuations: Secondary | ICD-10-CM

## 2024-07-12 MED ORDER — CARBIDOPA-LEVODOPA ER 50-200 MG PO TBCR: 1.0000 | EXTENDED_RELEASE_TABLET | Freq: Every day | ORAL | 1 refills | Status: AC

## 2024-07-12 NOTE — Telephone Encounter (Signed)
 Wife aware rx was sent to  pharmacy for refill

## 2024-07-12 NOTE — Telephone Encounter (Signed)
 Pt wife  called to request for MD to place order for Pt medication refill carbidopa -levodopa  (SINEMET  CR) 50-200 MG tablet   Pt is out of medication  and [facility] is stating that  Pt medication  would not be sent out soon they need a New Rx. Pt wife is requesting while they wait on [facility] can Pt mediation at least some be sent to [facility] drug do pt wont miss any  dose.

## 2024-07-15 MED ORDER — CARBIDOPA-LEVODOPA 25-100 MG PO TABS: 1.0000 | ORAL_TABLET | Freq: Four times a day (QID) | ORAL | 2 refills | Status: AC

## 2024-07-15 NOTE — Addendum Note (Signed)
 Addended by: SHONA SAVANT A on: 07/15/2024 11:47 AM   Modules accepted: Orders

## 2024-07-15 NOTE — Telephone Encounter (Signed)
 LVM to make patient and wife aware resent refill to pharmacy requested [facility]

## 2024-07-15 NOTE — Telephone Encounter (Signed)
 Wife reporting that [facility] has informed her that a Rx has not been sent to them yet for pt's carbidopa -levodopa  (SINEMET  IR) 25-100 MG tablet, please send Rx to [facility] as well.

## 2024-11-16 ENCOUNTER — Ambulatory Visit: Admitting: Family Medicine
